# Patient Record
Sex: Female | Born: 1980 | Race: Black or African American | Hispanic: No | Marital: Married | Smoking: Former smoker
Health system: Southern US, Community
[De-identification: ages and names within clinical notes are randomized; demographics above are authoritative.]

## PROBLEM LIST (undated history)

## (undated) ENCOUNTER — Inpatient Hospital Stay (HOSPITAL_COMMUNITY): Payer: Self-pay

## (undated) DIAGNOSIS — E876 Hypokalemia: Secondary | ICD-10-CM

## (undated) DIAGNOSIS — T4145XA Adverse effect of unspecified anesthetic, initial encounter: Secondary | ICD-10-CM

## (undated) DIAGNOSIS — Z9889 Other specified postprocedural states: Secondary | ICD-10-CM

## (undated) DIAGNOSIS — R112 Nausea with vomiting, unspecified: Secondary | ICD-10-CM

## (undated) DIAGNOSIS — G4700 Insomnia, unspecified: Secondary | ICD-10-CM

## (undated) DIAGNOSIS — IMO0002 Reserved for concepts with insufficient information to code with codable children: Secondary | ICD-10-CM

## (undated) DIAGNOSIS — I10 Essential (primary) hypertension: Secondary | ICD-10-CM

## (undated) DIAGNOSIS — R87629 Unspecified abnormal cytological findings in specimens from vagina: Secondary | ICD-10-CM

## (undated) DIAGNOSIS — T8859XA Other complications of anesthesia, initial encounter: Secondary | ICD-10-CM

## (undated) DIAGNOSIS — F329 Major depressive disorder, single episode, unspecified: Secondary | ICD-10-CM

## (undated) DIAGNOSIS — F32A Depression, unspecified: Secondary | ICD-10-CM

## (undated) HISTORY — PX: LAPAROSCOPIC GASTRIC SLEEVE RESECTION: SHX5895

## (undated) HISTORY — DX: Hypokalemia: E87.6

## (undated) HISTORY — PX: WISDOM TOOTH EXTRACTION: SHX21

## (undated) HISTORY — DX: Unspecified abnormal cytological findings in specimens from vagina: R87.629

## (undated) HISTORY — PX: BREAST SURGERY: SHX581

## (undated) HISTORY — PX: DILATION AND CURETTAGE OF UTERUS: SHX78

## (undated) HISTORY — DX: Reserved for concepts with insufficient information to code with codable children: IMO0002

## (undated) HISTORY — DX: Insomnia, unspecified: G47.00

---
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
## 2000-05-17 ENCOUNTER — Encounter: Payer: Self-pay | Admitting: Emergency Medicine

## 2000-05-17 ENCOUNTER — Emergency Department (HOSPITAL_COMMUNITY): Admission: EM | Admit: 2000-05-17 | Discharge: 2000-05-17 | Payer: Self-pay | Admitting: Emergency Medicine

## 2000-09-09 ENCOUNTER — Encounter: Payer: Self-pay | Admitting: Emergency Medicine

## 2000-09-09 ENCOUNTER — Emergency Department (HOSPITAL_COMMUNITY): Admission: EM | Admit: 2000-09-09 | Discharge: 2000-09-09 | Payer: Self-pay | Admitting: Internal Medicine

## 2001-06-05 ENCOUNTER — Inpatient Hospital Stay (HOSPITAL_COMMUNITY): Admission: AD | Admit: 2001-06-05 | Discharge: 2001-06-05 | Payer: Self-pay | Admitting: *Deleted

## 2001-06-09 ENCOUNTER — Encounter: Admission: RE | Admit: 2001-06-09 | Discharge: 2001-06-09 | Payer: Self-pay | Admitting: Obstetrics

## 2001-06-30 ENCOUNTER — Encounter: Admission: RE | Admit: 2001-06-30 | Discharge: 2001-06-30 | Payer: Self-pay | Admitting: Obstetrics

## 2001-07-10 ENCOUNTER — Inpatient Hospital Stay (HOSPITAL_COMMUNITY): Admission: AD | Admit: 2001-07-10 | Discharge: 2001-07-10 | Payer: Self-pay | Admitting: *Deleted

## 2001-09-13 ENCOUNTER — Inpatient Hospital Stay (HOSPITAL_COMMUNITY): Admission: AD | Admit: 2001-09-13 | Discharge: 2001-09-13 | Payer: Self-pay | Admitting: *Deleted

## 2001-09-16 ENCOUNTER — Inpatient Hospital Stay (HOSPITAL_COMMUNITY): Admission: AD | Admit: 2001-09-16 | Discharge: 2001-09-16 | Payer: Self-pay | Admitting: *Deleted

## 2001-10-12 ENCOUNTER — Inpatient Hospital Stay (HOSPITAL_COMMUNITY): Admission: AD | Admit: 2001-10-12 | Discharge: 2001-10-12 | Payer: Self-pay | Admitting: Obstetrics

## 2001-11-03 ENCOUNTER — Encounter: Admission: RE | Admit: 2001-11-03 | Discharge: 2001-11-03 | Payer: Self-pay | Admitting: Obstetrics

## 2001-11-17 ENCOUNTER — Ambulatory Visit (HOSPITAL_COMMUNITY): Admission: RE | Admit: 2001-11-17 | Discharge: 2001-11-17 | Payer: Self-pay | Admitting: Obstetrics

## 2001-12-16 ENCOUNTER — Inpatient Hospital Stay (HOSPITAL_COMMUNITY): Admission: AD | Admit: 2001-12-16 | Discharge: 2001-12-16 | Payer: Self-pay | Admitting: *Deleted

## 2002-02-02 ENCOUNTER — Inpatient Hospital Stay (HOSPITAL_COMMUNITY): Admission: AD | Admit: 2002-02-02 | Discharge: 2002-02-02 | Payer: Self-pay | Admitting: Obstetrics & Gynecology

## 2002-06-27 ENCOUNTER — Emergency Department (HOSPITAL_COMMUNITY): Admission: EM | Admit: 2002-06-27 | Discharge: 2002-06-27 | Payer: Self-pay | Admitting: Emergency Medicine

## 2003-01-12 ENCOUNTER — Inpatient Hospital Stay (HOSPITAL_COMMUNITY): Admission: AD | Admit: 2003-01-12 | Discharge: 2003-01-12 | Payer: Self-pay | Admitting: Family Medicine

## 2003-04-04 ENCOUNTER — Emergency Department (HOSPITAL_COMMUNITY): Admission: EM | Admit: 2003-04-04 | Discharge: 2003-04-04 | Payer: Self-pay | Admitting: *Deleted

## 2003-05-29 ENCOUNTER — Other Ambulatory Visit: Admission: RE | Admit: 2003-05-29 | Discharge: 2003-05-29 | Payer: Self-pay | Admitting: Family Medicine

## 2003-08-16 ENCOUNTER — Inpatient Hospital Stay (HOSPITAL_COMMUNITY): Admission: AD | Admit: 2003-08-16 | Discharge: 2003-08-16 | Payer: Self-pay | Admitting: Obstetrics & Gynecology

## 2003-11-06 ENCOUNTER — Emergency Department (HOSPITAL_COMMUNITY): Admission: EM | Admit: 2003-11-06 | Discharge: 2003-11-06 | Payer: Self-pay | Admitting: Emergency Medicine

## 2004-01-15 ENCOUNTER — Emergency Department (HOSPITAL_COMMUNITY): Admission: EM | Admit: 2004-01-15 | Discharge: 2004-01-15 | Payer: Self-pay | Admitting: Emergency Medicine

## 2004-11-13 ENCOUNTER — Emergency Department (HOSPITAL_COMMUNITY): Admission: EM | Admit: 2004-11-13 | Discharge: 2004-11-13 | Payer: Self-pay | Admitting: Family Medicine

## 2005-05-05 ENCOUNTER — Emergency Department (HOSPITAL_COMMUNITY): Admission: EM | Admit: 2005-05-05 | Discharge: 2005-05-05 | Payer: Self-pay | Admitting: Family Medicine

## 2005-05-09 ENCOUNTER — Emergency Department (HOSPITAL_COMMUNITY): Admission: EM | Admit: 2005-05-09 | Discharge: 2005-05-09 | Payer: Self-pay | Admitting: Emergency Medicine

## 2005-08-03 ENCOUNTER — Emergency Department (HOSPITAL_COMMUNITY): Admission: EM | Admit: 2005-08-03 | Discharge: 2005-08-03 | Payer: Self-pay | Admitting: Emergency Medicine

## 2005-08-23 ENCOUNTER — Emergency Department (HOSPITAL_COMMUNITY): Admission: EM | Admit: 2005-08-23 | Discharge: 2005-08-23 | Payer: Self-pay | Admitting: Emergency Medicine

## 2005-11-09 DIAGNOSIS — Z9889 Other specified postprocedural states: Secondary | ICD-10-CM

## 2005-11-09 HISTORY — DX: Other specified postprocedural states: Z98.890

## 2006-09-13 ENCOUNTER — Emergency Department (HOSPITAL_COMMUNITY): Admission: EM | Admit: 2006-09-13 | Discharge: 2006-09-13 | Payer: Self-pay | Admitting: Family Medicine

## 2007-05-30 ENCOUNTER — Emergency Department (HOSPITAL_COMMUNITY): Admission: EM | Admit: 2007-05-30 | Discharge: 2007-05-30 | Payer: Self-pay | Admitting: Family Medicine

## 2007-07-11 ENCOUNTER — Emergency Department (HOSPITAL_COMMUNITY): Admission: EM | Admit: 2007-07-11 | Discharge: 2007-07-11 | Payer: Self-pay | Admitting: Emergency Medicine

## 2007-08-17 ENCOUNTER — Ambulatory Visit: Payer: Self-pay | Admitting: Gastroenterology

## 2007-08-17 LAB — CONVERTED CEMR LAB
AST: 17 units/L (ref 0–37)
Albumin: 3.3 g/dL — ABNORMAL LOW (ref 3.5–5.2)
BUN: 7 mg/dL (ref 6–23)
Basophils Relative: 0.6 % (ref 0.0–1.0)
Creatinine, Ser: 0.6 mg/dL (ref 0.4–1.2)
GFR calc Af Amer: 155 mL/min
GFR calc non Af Amer: 128 mL/min
HCT: 35.9 % — ABNORMAL LOW (ref 36.0–46.0)
Hemoglobin: 12 g/dL (ref 12.0–15.0)
Monocytes Absolute: 0.7 10*3/uL (ref 0.2–0.7)
Monocytes Relative: 7.8 % (ref 3.0–11.0)
Potassium: 4 meq/L (ref 3.5–5.1)
RDW: 13.9 % (ref 11.5–14.6)
Total Bilirubin: 0.5 mg/dL (ref 0.3–1.2)
Total Protein: 6.5 g/dL (ref 6.0–8.3)

## 2007-08-23 ENCOUNTER — Encounter: Admission: RE | Admit: 2007-08-23 | Discharge: 2007-08-23 | Payer: Self-pay | Admitting: Gastroenterology

## 2007-11-27 ENCOUNTER — Emergency Department (HOSPITAL_COMMUNITY): Admission: EM | Admit: 2007-11-27 | Discharge: 2007-11-27 | Payer: Self-pay | Admitting: *Deleted

## 2008-01-19 DIAGNOSIS — K5900 Constipation, unspecified: Secondary | ICD-10-CM | POA: Insufficient documentation

## 2008-01-31 ENCOUNTER — Emergency Department (HOSPITAL_COMMUNITY): Admission: EM | Admit: 2008-01-31 | Discharge: 2008-01-31 | Payer: Self-pay | Admitting: Family Medicine

## 2008-07-04 ENCOUNTER — Other Ambulatory Visit: Admission: RE | Admit: 2008-07-04 | Discharge: 2008-07-04 | Payer: Self-pay | Admitting: Gynecology

## 2008-08-07 ENCOUNTER — Ambulatory Visit (HOSPITAL_COMMUNITY): Admission: RE | Admit: 2008-08-07 | Discharge: 2008-08-07 | Payer: Self-pay | Admitting: Gynecology

## 2008-08-24 ENCOUNTER — Emergency Department (HOSPITAL_COMMUNITY): Admission: EM | Admit: 2008-08-24 | Discharge: 2008-08-24 | Payer: Self-pay | Admitting: Emergency Medicine

## 2008-11-05 ENCOUNTER — Emergency Department (HOSPITAL_COMMUNITY): Admission: EM | Admit: 2008-11-05 | Discharge: 2008-11-05 | Payer: Self-pay | Admitting: Family Medicine

## 2008-11-09 ENCOUNTER — Emergency Department (HOSPITAL_COMMUNITY): Admission: EM | Admit: 2008-11-09 | Discharge: 2008-11-09 | Payer: Self-pay | Admitting: Family Medicine

## 2009-01-24 ENCOUNTER — Inpatient Hospital Stay (HOSPITAL_COMMUNITY): Admission: AD | Admit: 2009-01-24 | Discharge: 2009-01-24 | Payer: Self-pay | Admitting: Obstetrics and Gynecology

## 2009-01-24 ENCOUNTER — Ambulatory Visit: Payer: Self-pay | Admitting: Obstetrics and Gynecology

## 2009-11-09 DIAGNOSIS — R87619 Unspecified abnormal cytological findings in specimens from cervix uteri: Secondary | ICD-10-CM

## 2009-11-09 DIAGNOSIS — IMO0002 Reserved for concepts with insufficient information to code with codable children: Secondary | ICD-10-CM

## 2009-11-09 HISTORY — DX: Reserved for concepts with insufficient information to code with codable children: IMO0002

## 2009-11-09 HISTORY — DX: Unspecified abnormal cytological findings in specimens from cervix uteri: R87.619

## 2010-03-23 ENCOUNTER — Inpatient Hospital Stay (HOSPITAL_COMMUNITY): Admission: AD | Admit: 2010-03-23 | Discharge: 2010-03-23 | Payer: Self-pay | Admitting: Obstetrics and Gynecology

## 2010-12-02 ENCOUNTER — Inpatient Hospital Stay (HOSPITAL_COMMUNITY)
Admission: AD | Admit: 2010-12-02 | Discharge: 2010-12-02 | Payer: Self-pay | Source: Home / Self Care | Attending: Obstetrics and Gynecology | Admitting: Obstetrics and Gynecology

## 2010-12-03 LAB — URINALYSIS, ROUTINE W REFLEX MICROSCOPIC
Nitrite: NEGATIVE
Specific Gravity, Urine: 1.025 (ref 1.005–1.030)
pH: 6 (ref 5.0–8.0)

## 2010-12-03 LAB — ABO/RH: ABO/RH(D): O POS

## 2010-12-03 LAB — POCT PREGNANCY, URINE: Preg Test, Ur: POSITIVE

## 2010-12-03 LAB — CBC
Hemoglobin: 11.8 g/dL — ABNORMAL LOW (ref 12.0–15.0)
MCHC: 33.1 g/dL (ref 30.0–36.0)
RBC: 4.55 MIL/uL (ref 3.87–5.11)
WBC: 9.9 10*3/uL (ref 4.0–10.5)

## 2010-12-03 LAB — HCG, QUANTITATIVE, PREGNANCY: hCG, Beta Chain, Quant, S: 450 m[IU]/mL — ABNORMAL HIGH (ref <5)

## 2010-12-24 LAB — RPR: RPR: NONREACTIVE

## 2010-12-24 LAB — ANTIBODY SCREEN: Antibody Screen: NEGATIVE

## 2010-12-24 LAB — GC/CHLAMYDIA PROBE AMP, GENITAL: Chlamydia: NEGATIVE

## 2010-12-29 ENCOUNTER — Inpatient Hospital Stay (HOSPITAL_COMMUNITY)
Admission: AD | Admit: 2010-12-29 | Discharge: 2010-12-29 | Disposition: A | Discharge status: Home / Self Care | Payer: Medicaid Other | Source: Ambulatory Visit | Attending: Obstetrics and Gynecology | Admitting: Obstetrics and Gynecology

## 2010-12-29 DIAGNOSIS — O210 Mild hyperemesis gravidarum: Secondary | ICD-10-CM | POA: Insufficient documentation

## 2010-12-29 LAB — URINALYSIS, ROUTINE W REFLEX MICROSCOPIC
Bilirubin Urine: NEGATIVE
Hgb urine dipstick: NEGATIVE
Ketones, ur: NEGATIVE mg/dL
Protein, ur: NEGATIVE mg/dL
Urine Glucose, Fasting: NEGATIVE mg/dL

## 2011-01-26 LAB — URINE MICROSCOPIC-ADD ON

## 2011-01-26 LAB — CBC
MCV: 82 fL (ref 78.0–100.0)
Platelets: 295 10*3/uL (ref 150–400)
RDW: 15.8 % — ABNORMAL HIGH (ref 11.5–15.5)
WBC: 10.7 10*3/uL — ABNORMAL HIGH (ref 4.0–10.5)

## 2011-01-26 LAB — URINALYSIS, ROUTINE W REFLEX MICROSCOPIC
Bilirubin Urine: NEGATIVE
Glucose, UA: NEGATIVE mg/dL
Ketones, ur: NEGATIVE mg/dL
pH: 6 (ref 5.0–8.0)

## 2011-01-26 LAB — POCT PREGNANCY, URINE: Preg Test, Ur: NEGATIVE

## 2011-02-19 LAB — HCG, QUANTITATIVE, PREGNANCY: hCG, Beta Chain, Quant, S: 173 m[IU]/mL — ABNORMAL HIGH (ref <5)

## 2011-02-19 LAB — CBC
HCT: 36.3 % (ref 36.0–46.0)
Hemoglobin: 11.9 g/dL — ABNORMAL LOW (ref 12.0–15.0)
Platelets: 288 10*3/uL (ref 150–400)
WBC: 10.5 10*3/uL (ref 4.0–10.5)

## 2011-02-19 LAB — URINALYSIS, ROUTINE W REFLEX MICROSCOPIC
Bilirubin Urine: NEGATIVE
Nitrite: NEGATIVE
Specific Gravity, Urine: 1.02 (ref 1.005–1.030)
Urobilinogen, UA: 0.2 mg/dL (ref 0.0–1.0)
pH: 6 (ref 5.0–8.0)

## 2011-02-19 LAB — DIFFERENTIAL
Basophils Absolute: 0 10*3/uL (ref 0.0–0.1)
Eosinophils Relative: 1 % (ref 0–5)
Lymphocytes Relative: 24 % (ref 12–46)
Lymphs Abs: 2.5 10*3/uL (ref 0.7–4.0)
Neutro Abs: 7 10*3/uL (ref 1.7–7.7)

## 2011-03-24 NOTE — Assessment & Plan Note (Signed)
[facility]                         GASTROENTEROLOGY OFFICE NOTE   Michelle Duarte, Michelle Duarte                        MRN:          [id number]  DATE:08/17/2007                            DOB:          04-12-1981    CHIEF COMPLAINT:  A 30 year old African-American female self referred  for episodic epigastric pain, nausea, and vomiting.   HISTORY OF PRESENT ILLNESS:  The patient relates a several month history  of episodic postprandial epigastric pain associated with nausea and  vomiting.  Her symptoms occur more frequently after greasy or fatty  foods.  She has noted an increase in intestinal gas as well with  increased belching and flatus after some meals.  She notes no substernal  burning or regurgitation.  She has had intermittent problems with mild  constipation associated with intermittent intestinal gas and bloating.  These symptoms do not appear to be related to her episodic epigastric  pain, nausea and vomiting.  She relates no hematemesis, dysphagia,  odynophagia, melena, hematochezia, or change in stool caliber.  No  family history of colon cancer, colon polyps, or inflammatory bowel  disease.   PAST MEDICAL HISTORY:  Status post bilateral breast reduction  possible history of lactose intolerance.   CURRENT MEDICATIONS:  None.   ALLERGIES:  No known drug allergies.   SOCIAL HISTORY:   REVIEW OF SYSTEMS:  Per the handwritten form.   PHYSICAL EXAMINATION:  GENERAL:  Overweight, no acute distress.  VITAL SIGNS:  Height 5 feet 3 inches, weight 233.8 pounds, blood  pressure 122/78, pulse 88 and regular.  HEENT:  Anicteric sclerae, oropharynx clear.  CHEST:  Clear to auscultation bilaterally.  HEART:  Regular rate and rhythm without murmurs appreciated.  ABDOMEN:  Soft and nondistended.  Minimal epigastric tenderness to deep  palpation.  No rebound or guarding.  No palpable organomegaly, masses,  or hernias.  Normal active bowel sounds.  EXTREMITIES:  Without cyanosis, clubbing, or edema.  NEUROLOGY:  Alert and oriented x3.  Grossly nonfocal.   ASSESSMENT:  Rule out symptomatic cholelithiasis, gastroesophageal  reflux disease, and ulcer disease.  She also appears to have a component  of constipation and intestinal gas.  Obtain a CBC, CMET, lipase, and TSH  today.  Schedule an abdominal ultrasound.  If the above evaluation is  not diagnostic, we will plan to proceed with an upper endoscopy for  further  evaluation.  Risks, benefits, and alternatives to upper endoscopy with  possible biopsy discussed  with the patient and she agrees to proceed if the ultrasound is not  diagnostic.  She is to maintain a low fat diet for now.     Venita Lick. Russella Dar, MD, [facility]  Electronically Signed    MTS/MedQ  DD: 08/17/2007  DT: 08/17/2007  Job #: [id number]

## 2011-08-03 ENCOUNTER — Encounter (HOSPITAL_COMMUNITY): Payer: Self-pay | Admitting: *Deleted

## 2011-08-03 ENCOUNTER — Telehealth (HOSPITAL_COMMUNITY): Payer: Self-pay | Admitting: *Deleted

## 2011-08-03 LAB — GC/CHLAMYDIA PROBE AMP, GENITAL
Chlamydia, DNA Probe: NEGATIVE
GC Probe Amp, Genital: NEGATIVE

## 2011-08-03 LAB — WET PREP, GENITAL: Trich, Wet Prep: NONE SEEN

## 2011-08-03 NOTE — Telephone Encounter (Signed)
Preadmission screen  

## 2011-08-04 ENCOUNTER — Encounter (HOSPITAL_COMMUNITY): Payer: Self-pay | Admitting: *Deleted

## 2011-08-05 ENCOUNTER — Other Ambulatory Visit: Payer: Self-pay | Admitting: Obstetrics and Gynecology

## 2011-08-05 ENCOUNTER — Inpatient Hospital Stay (HOSPITAL_COMMUNITY)
Admission: RE | Admit: 2011-08-05 | Discharge: 2011-08-08 | DRG: 775 | Disposition: A | Discharge status: Home / Self Care | Payer: Medicaid Other | Source: Ambulatory Visit | Attending: Obstetrics and Gynecology | Admitting: Obstetrics and Gynecology

## 2011-08-05 DIAGNOSIS — K5900 Constipation, unspecified: Secondary | ICD-10-CM

## 2011-08-05 LAB — CBC
HCT: 32.4 % — ABNORMAL LOW (ref 36.0–46.0)
Hemoglobin: 10.6 g/dL — ABNORMAL LOW (ref 12.0–15.0)
MCH: 26 pg (ref 26.0–34.0)
MCV: 79.4 fL (ref 78.0–100.0)
Platelets: 269 10*3/uL (ref 150–400)
RBC: 4.08 MIL/uL (ref 3.87–5.11)

## 2011-08-05 MED ORDER — LACTATED RINGERS IV SOLN
INTRAVENOUS | Status: DC
  Administered 2011-08-06: 500 mL via INTRAVENOUS
  Administered 2011-08-06 (×2): via INTRAVENOUS

## 2011-08-05 MED ORDER — FLEET ENEMA 7-19 GM/118ML RE ENEM: 1.0000 | ENEMA | RECTAL | Status: DC | PRN

## 2011-08-05 MED ORDER — OXYTOCIN 20 UNITS IN LACTATED RINGERS INFUSION - SIMPLE
125.0000 mL/h | Freq: Once | INTRAVENOUS | Status: AC
  Administered 2011-08-06: 125 mL/h via INTRAVENOUS

## 2011-08-05 MED ORDER — LIDOCAINE HCL (PF) 1 % IJ SOLN
30.0000 mL | INTRAMUSCULAR | Status: DC | PRN
  Filled 2011-08-05 (×3): qty 30

## 2011-08-05 MED ORDER — ACETAMINOPHEN 325 MG PO TABS
650.0000 mg | ORAL_TABLET | ORAL | Status: DC | PRN
  Administered 2011-08-05 – 2011-08-06 (×2): 650 mg via ORAL
  Filled 2011-08-05 (×2): qty 2

## 2011-08-05 MED ORDER — TERBUTALINE SULFATE 1 MG/ML IJ SOLN: 0.2500 mg | Freq: Once | INTRAMUSCULAR | Status: AC | PRN

## 2011-08-05 MED ORDER — MISOPROSTOL 25 MCG QUARTER TABLET
25.0000 ug | ORAL_TABLET | ORAL | Status: DC | PRN
  Administered 2011-08-05 – 2011-08-06 (×2): 25 ug via VAGINAL
  Filled 2011-08-05: qty 1
  Filled 2011-08-05 (×2): qty 0.25

## 2011-08-05 MED ORDER — CITRIC ACID-SODIUM CITRATE 334-500 MG/5ML PO SOLN: 30.0000 mL | ORAL | Status: DC | PRN

## 2011-08-05 MED ORDER — OXYCODONE-ACETAMINOPHEN 5-325 MG PO TABS: 2.0000 | ORAL_TABLET | ORAL | Status: DC | PRN

## 2011-08-05 MED ORDER — ZOLPIDEM TARTRATE 10 MG PO TABS
10.0000 mg | ORAL_TABLET | Freq: Every evening | ORAL | Status: DC | PRN
  Administered 2011-08-05: 10 mg via ORAL
  Filled 2011-08-05: qty 1

## 2011-08-05 MED ORDER — LACTATED RINGERS IV SOLN: 500.0000 mL | INTRAVENOUS | Status: DC | PRN

## 2011-08-05 MED ORDER — IBUPROFEN 600 MG PO TABS: 600.0000 mg | ORAL_TABLET | Freq: Four times a day (QID) | ORAL | Status: DC | PRN

## 2011-08-05 MED ORDER — OXYTOCIN BOLUS FROM INFUSION
500.0000 mL | Freq: Once | INTRAVENOUS | Status: DC
  Filled 2011-08-05: qty 500

## 2011-08-05 MED ORDER — ONDANSETRON HCL 4 MG/2ML IJ SOLN: 4.0000 mg | Freq: Four times a day (QID) | INTRAMUSCULAR | Status: DC | PRN

## 2011-08-05 NOTE — H&P (Signed)
Michelle Duarte is an 30 y.o. female. G2P0 at 40 weeks with favorable cervix. EFW by palpation is 8.5 pounds.  Pertinent Gynecological History: Menses: pregnant Bleeding: pregnant Contraception: pregnant DES exposure: unknown Blood transfusions: none Sexually transmitted diseases: no past history Previous GYN Procedures: none  Last mammogram: none Date: none Last pap: normal Date: 01/19/11 OB History: G2, P0   Menstrual History: Menarche age: unknown Patient's last menstrual period was 11/05/2010.    Past Medical History  Diagnosis Date  . Abnormal Pap smear 2011    Past Surgical History  Procedure Date  . Breast surgery     reduction    Family History  Problem Relation Age of Onset  . Hypertension Mother   . Drug abuse Mother     recovering addict  . Hypertension Maternal Aunt   . Diabetes Paternal Aunt   . Hypertension Maternal Grandmother   . Diabetes Paternal Grandmother     Social History:  reports that she has never smoked. She does not have any smokeless tobacco history on file. She reports that she does not drink alcohol or use illicit drugs.  Allergies: Allergies no known allergies   (Not in a hospital admission)  Review of Systems  Constitutional: Negative for fever and chills.  Eyes: Negative for blurred vision.  Neurological: Negative for headaches.    Last menstrual period 11/05/2010. Physical Exam  Constitutional: She appears well-developed and well-nourished.  Cardiovascular: Normal rate and regular rhythm.   Respiratory: Effort normal and breath sounds normal.  Neurological: She has normal reflexes.    No results found for this or any previous visit (from the past 24 hour(s)).  No results found.  Assessment/Plan: 30 yo G2P0 at term with favorable cx and EFW 8.5 pounds by palpation for 2 stage induction of labor  Michelle Duarte,Michelle Duarte E 08/05/2011, 6:30 PM

## 2011-08-06 ENCOUNTER — Inpatient Hospital Stay (HOSPITAL_COMMUNITY): Payer: Medicaid Other | Admitting: Anesthesiology

## 2011-08-06 ENCOUNTER — Encounter (HOSPITAL_COMMUNITY): Payer: Self-pay | Admitting: Anesthesiology

## 2011-08-06 ENCOUNTER — Encounter (HOSPITAL_COMMUNITY): Payer: Self-pay

## 2011-08-06 MED ORDER — BISACODYL 10 MG RE SUPP: 10.0000 mg | Freq: Every day | RECTAL | Status: DC | PRN

## 2011-08-06 MED ORDER — DIPHENHYDRAMINE HCL 50 MG/ML IJ SOLN: 12.5000 mg | INTRAMUSCULAR | Status: DC | PRN

## 2011-08-06 MED ORDER — PHENYLEPHRINE 40 MCG/ML (10ML) SYRINGE FOR IV PUSH (FOR BLOOD PRESSURE SUPPORT)
80.0000 ug | PREFILLED_SYRINGE | INTRAVENOUS | Status: DC | PRN
  Filled 2011-08-06 (×2): qty 5

## 2011-08-06 MED ORDER — EPHEDRINE 5 MG/ML INJ
10.0000 mg | INTRAVENOUS | Status: DC | PRN
  Filled 2011-08-06: qty 4

## 2011-08-06 MED ORDER — SENNOSIDES-DOCUSATE SODIUM 8.6-50 MG PO TABS
2.0000 | ORAL_TABLET | Freq: Every day | ORAL | Status: DC
  Administered 2011-08-06: 2 via ORAL

## 2011-08-06 MED ORDER — FLEET ENEMA 7-19 GM/118ML RE ENEM: 1.0000 | ENEMA | RECTAL | Status: DC | PRN

## 2011-08-06 MED ORDER — IBUPROFEN 600 MG PO TABS
600.0000 mg | ORAL_TABLET | Freq: Four times a day (QID) | ORAL | Status: DC
  Administered 2011-08-07 – 2011-08-08 (×6): 600 mg via ORAL
  Filled 2011-08-06 (×7): qty 1

## 2011-08-06 MED ORDER — ONDANSETRON HCL 4 MG/2ML IJ SOLN: 4.0000 mg | INTRAMUSCULAR | Status: DC | PRN

## 2011-08-06 MED ORDER — LANOLIN HYDROUS EX OINT: TOPICAL_OINTMENT | CUTANEOUS | Status: DC | PRN

## 2011-08-06 MED ORDER — EPHEDRINE 5 MG/ML INJ
10.0000 mg | INTRAVENOUS | Status: DC | PRN
  Filled 2011-08-06 (×2): qty 4

## 2011-08-06 MED ORDER — SIMETHICONE 80 MG PO CHEW: 80.0000 mg | CHEWABLE_TABLET | ORAL | Status: DC | PRN

## 2011-08-06 MED ORDER — FENTANYL 2.5 MCG/ML BUPIVACAINE 1/10 % EPIDURAL INFUSION (WH - ANES)
14.0000 mL/h | INTRAMUSCULAR | Status: DC
  Administered 2011-08-06 (×3): 14 mL/h via EPIDURAL
  Filled 2011-08-06 (×4): qty 60

## 2011-08-06 MED ORDER — LIDOCAINE HCL 1.5 % IJ SOLN
INTRAMUSCULAR | Status: DC | PRN
  Administered 2011-08-06 (×2): 4 mL via EPIDURAL

## 2011-08-06 MED ORDER — ONDANSETRON HCL 4 MG PO TABS
4.0000 mg | ORAL_TABLET | ORAL | Status: DC | PRN
Start: 2011-08-06 — End: 2011-08-08

## 2011-08-06 MED ORDER — OXYCODONE-ACETAMINOPHEN 5-325 MG PO TABS
1.0000 | ORAL_TABLET | ORAL | Status: DC | PRN
  Administered 2011-08-07 – 2011-08-08 (×2): 1 via ORAL
  Filled 2011-08-06 (×2): qty 1

## 2011-08-06 MED ORDER — DIPHENHYDRAMINE HCL 25 MG PO CAPS: 25.0000 mg | ORAL_CAPSULE | Freq: Four times a day (QID) | ORAL | Status: DC | PRN

## 2011-08-06 MED ORDER — WITCH HAZEL-GLYCERIN EX PADS: 1.0000 "application " | MEDICATED_PAD | CUTANEOUS | Status: DC | PRN

## 2011-08-06 MED ORDER — ZOLPIDEM TARTRATE 5 MG PO TABS: 5.0000 mg | ORAL_TABLET | Freq: Every evening | ORAL | Status: DC | PRN

## 2011-08-06 MED ORDER — TERBUTALINE SULFATE 1 MG/ML IJ SOLN: 0.2500 mg | Freq: Once | INTRAMUSCULAR | Status: DC | PRN

## 2011-08-06 MED ORDER — BENZOCAINE-MENTHOL 20-0.5 % EX AERO: 1.0000 "application " | INHALATION_SPRAY | CUTANEOUS | Status: DC | PRN

## 2011-08-06 MED ORDER — OXYTOCIN 20 UNITS IN LACTATED RINGERS INFUSION - SIMPLE
1.0000 m[IU]/min | INTRAVENOUS | Status: DC
  Administered 2011-08-06: 4 m[IU]/min via INTRAVENOUS
  Administered 2011-08-06: 1 m[IU]/min via INTRAVENOUS
  Filled 2011-08-06: qty 1000

## 2011-08-06 MED ORDER — FENTANYL 2.5 MCG/ML BUPIVACAINE 1/10 % EPIDURAL INFUSION (WH - ANES)
INTRAMUSCULAR | Status: DC | PRN
  Administered 2011-08-06: 14 mL/h via EPIDURAL

## 2011-08-06 MED ORDER — DIBUCAINE 1 % RE OINT: 1.0000 "application " | TOPICAL_OINTMENT | RECTAL | Status: DC | PRN

## 2011-08-06 MED ORDER — LACTATED RINGERS IV SOLN: 500.0000 mL | Freq: Once | INTRAVENOUS | Status: DC

## 2011-08-06 MED ORDER — SODIUM CHLORIDE 0.9 % IV SOLN
3.0000 g | Freq: Four times a day (QID) | INTRAVENOUS | Status: DC
  Administered 2011-08-06: 3 g via INTRAVENOUS
  Filled 2011-08-06 (×3): qty 3

## 2011-08-06 MED ORDER — PRENATAL PLUS 27-1 MG PO TABS
1.0000 | ORAL_TABLET | Freq: Every day | ORAL | Status: DC
  Administered 2011-08-07 – 2011-08-08 (×2): 1 via ORAL
  Filled 2011-08-06 (×2): qty 1

## 2011-08-06 MED ORDER — BUTORPHANOL TARTRATE 2 MG/ML IJ SOLN
1.0000 mg | INTRAMUSCULAR | Status: DC | PRN
  Administered 2011-08-06: 1 mg via INTRAVENOUS
  Filled 2011-08-06: qty 1

## 2011-08-06 MED ORDER — TETANUS-DIPHTH-ACELL PERTUSSIS 5-2.5-18.5 LF-MCG/0.5 IM SUSP: 0.5000 mL | Freq: Once | INTRAMUSCULAR | Status: DC

## 2011-08-06 MED ORDER — PHENYLEPHRINE 40 MCG/ML (10ML) SYRINGE FOR IV PUSH (FOR BLOOD PRESSURE SUPPORT)
80.0000 ug | PREFILLED_SYRINGE | INTRAVENOUS | Status: DC | PRN
  Filled 2011-08-06: qty 5

## 2011-08-06 MED ORDER — PROMETHAZINE HCL 25 MG/ML IJ SOLN
12.5000 mg | INTRAMUSCULAR | Status: DC | PRN
  Administered 2011-08-06: 12.5 mg via INTRAVENOUS
  Filled 2011-08-06: qty 1

## 2011-08-06 NOTE — Progress Notes (Signed)
FHT reactive, good response to scalp stim Cx 4/C/-2 more anterior IUPC had pushed out-removed   Fresh IUPC placed UCs q2min-will assess MVUs Pitocin on, epidural in

## 2011-08-06 NOTE — Progress Notes (Signed)
FHT + accels UCs q3-4 min Epidural in Cx 4/C/-2/vtx AROM small amt clear fluid IUPC placed

## 2011-08-06 NOTE — Progress Notes (Signed)
Delivery Note Rapid second stage (less than 15 min) SVD VFI  Apgars 9/9 Wt/art pH - pending Placenta 3 vessels, intact EBL  300cc Cx/vag/perineum intact Pt/infant stable in LDR

## 2011-08-06 NOTE — Anesthesia Preprocedure Evaluation (Addendum)
Anesthesia Evaluation  Name, MR# and DOB Patient awake  General Assessment Comment  Reviewed: Allergy & Precautions, H&P , Patient's Chart, lab work & pertinent test results  Airway Mallampati: III TM Distance: >3 FB Neck ROM: full    Dental No notable dental hx. (+) Teeth Intact   Pulmonary  clear to auscultation  pulmonary exam normalPulmonary Exam Normal breath sounds clear to auscultation none    Cardiovascular regular Normal    Neuro/Psych Negative Neurological ROS  Negative Psych ROS  GI/Hepatic/Renal negative GI ROS  negative Liver ROS  negative Renal ROS        Endo/Other  Negative Endocrine ROS (+)   Morbid obesity  Abdominal   Musculoskeletal   Hematology negative hematology ROS (+)   Peds  Reproductive/Obstetrics (+) Pregnancy    Anesthesia Other Findings             Anesthesia Physical Anesthesia Plan  ASA: III  Anesthesia Plan: Epidural   Post-op Pain Management:    Induction:   Airway Management Planned:   Additional Equipment:   Intra-op Plan:   Post-operative Plan:   Informed Consent: I have reviewed the patients History and Physical, chart, labs and discussed the procedure including the risks, benefits and alternatives for the proposed anesthesia with the patient or authorized representative who has indicated his/her understanding and acceptance.     Plan Discussed with: Anesthesiologist  Anesthesia Plan Comments:         Anesthesia Quick Evaluation  

## 2011-08-06 NOTE — Progress Notes (Signed)
FHT with some accels UCs q3-4  About 30-40  Begin Pitocin augmentation D/W pt / husband

## 2011-08-06 NOTE — Anesthesia Procedure Notes (Signed)
Epidural Patient location during procedure: OB Start time: 08/06/2011 7:17 AM  Staffing Anesthesiologist: Stockton Nunley A. Performed by: anesthesiologist   Preanesthetic Checklist Completed: patient identified, site marked, surgical consent, pre-op evaluation, timeout performed, IV checked, risks and benefits discussed and monitors and equipment checked  Epidural Patient position: sitting Prep: site prepped and draped and DuraPrep Patient monitoring: continuous pulse ox and blood pressure Approach: midline Injection technique: LOR air  Needle:  Needle type: Tuohy  Needle gauge: 17 G Needle length: 9 cm Needle insertion depth: 8 cm Catheter type: closed end flexible Catheter size: 19 Gauge Catheter at skin depth: 13 cm Test dose: negative and 1.5% lidocaine  Assessment Events: blood not aspirated, injection not painful, no injection resistance, negative IV test and no paresthesia  Additional Notes Patient is more comfortable after epidural dosed. Please see RN's note for documentation of vital signs and FHR which are stable.

## 2011-08-07 LAB — CBC
MCV: 78.8 fL (ref 78.0–100.0)
Platelets: 236 10*3/uL (ref 150–400)
RBC: 3.53 MIL/uL — ABNORMAL LOW (ref 3.87–5.11)
RDW: 16.5 % — ABNORMAL HIGH (ref 11.5–15.5)
WBC: 13.8 10*3/uL — ABNORMAL HIGH (ref 4.0–10.5)

## 2011-08-07 MED ORDER — BENZOCAINE-MENTHOL 20-0.5 % EX AERO
INHALATION_SPRAY | CUTANEOUS | Status: AC
  Filled 2011-08-07: qty 56

## 2011-08-07 NOTE — Progress Notes (Signed)
UR Chart review completed.  

## 2011-08-07 NOTE — Progress Notes (Signed)
Post Partum Day 1 Subjective: no complaints and up ad lib  Objective: Blood pressure 100/65, pulse 87, temperature 98.6 F (37 C), temperature source Oral, resp. rate 18, height 5\' 5"  (1.651 m), weight 113.853 kg (251 lb), last menstrual period 11/05/2010, SpO2 93.00%, unknown if currently breastfeeding.  Physical Exam:  General: alert and cooperative Lochia: appropriate Uterine Fundus: firm Perineum intact DVT Evaluation: No evidence of DVT seen on physical exam.   Basename 08/07/11 0545 08/05/11 2100  HGB 9.1* 10.6*  HCT 27.8* 32.4*    Assessment/Plan: Plan for discharge tomorrow   LOS: 2 days   Makylee Sanborn G 08/07/2011, 8:21 AM

## 2011-08-08 NOTE — Discharge Summary (Signed)
Obstetric Discharge Summary Reason for Admission: induction of labor favorable cervix at term large efw Prenatal Procedures: ultrasound Intrapartum Procedures: spontaneous vaginal delivery Postpartum Procedures: none Complications-Operative and Postpartum: none Hemoglobin  Date Value Range Status  08/07/2011 9.1* 12.0-15.0 (g/dL) Final     HCT  Date Value Range Status  08/07/2011 27.8* 36.0-46.0 (%) Final    Discharge Diagnoses: Term Pregnancy-delivered  Discharge Information: Date: 08/08/2011 Activity: pelvic rest Diet: routine Medications: PNV Condition: stable Instructions: refer to practice specific booklet Discharge to: home   Newborn Data: Live born female  Birth Weight: 7 lb 2.1 oz (3235 g) APGAR: 9, 9  Home with mother.  Hollis Tuller S 08/08/2011, 9:24 AM

## 2011-08-08 NOTE — Anesthesia Postprocedure Evaluation (Signed)
Patient stable following vaginal delivery.  

## 2011-08-08 NOTE — Progress Notes (Signed)
Post Partum Day two Subjective: no complaints  Objective: Blood pressure 110/69, pulse 81, temperature 98 F (36.7 C), temperature source Oral, resp. rate 18, height 5\' 5"  (1.651 m), weight 251 lb (113.853 kg), last menstrual period 11/05/2010, SpO2 93.00%, unknown if currently breastfeeding.  Physical Exam:  General: alert Lochia: appropriate Uterine Fundus: firm Incision: no inc DVT Evaluation: No evidence of DVT seen on physical exam.   Basename 08/07/11 0545 08/05/11 2100  HGB 9.1* 10.6*  HCT 27.8* 32.4*    Assessment/Plan: Discharge home   LOS: 3 days   Joby Richart S 08/08/2011, 9:22 AM

## 2012-02-05 IMAGING — US US OB COMP LESS 14 WK
1 series · 14 of 28 positions shown · non-contrast
Comparison: None

CLINICAL DATA: *Abdominal pain Pregnancy; ; POSITIVE URINE
PREGNANCY TEST.  QUANTITATIVE BETA HCG PENDING.

OBSTETRIC <14 WK US AND TRANSVAGINAL OB US
TECHNIQUE: Both transabdominal and transvaginal ultrasound
examinations were performed for complete evaluation of the
gestation as well as the maternal uterus, adnexal regions, and
pelvic cul-de-sac.

[Series 1: us ob comp less 14 wks · 14 of 36 slices shown]
[im 2/36]
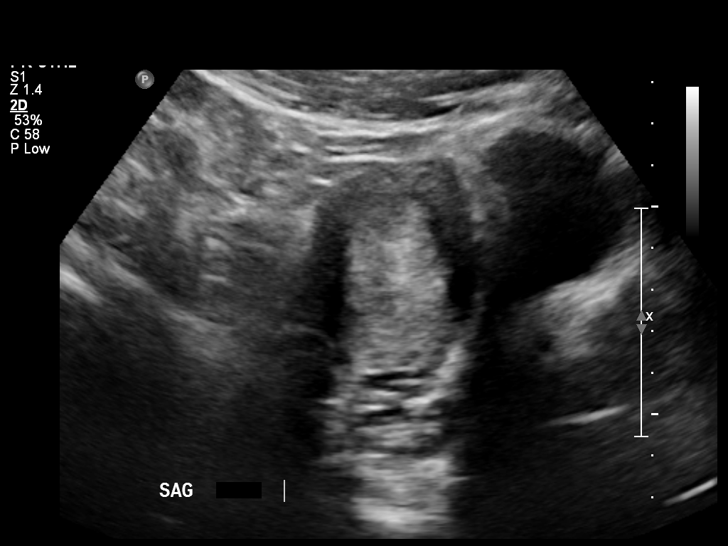
[im 4/36]
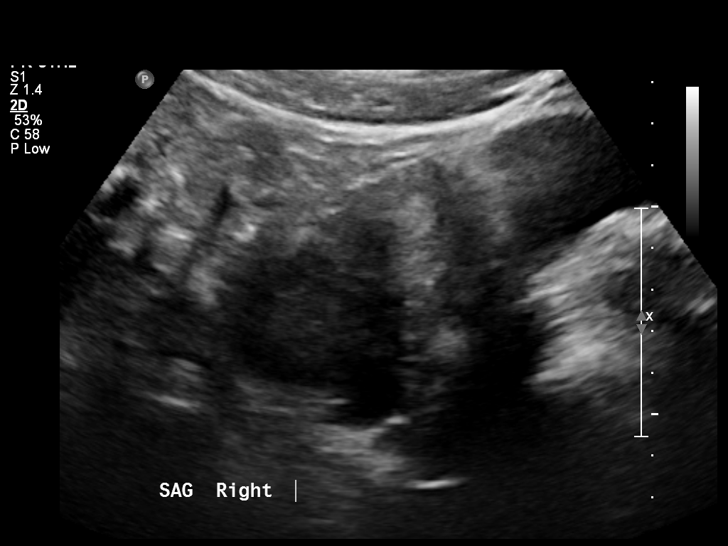
[im 7/36]
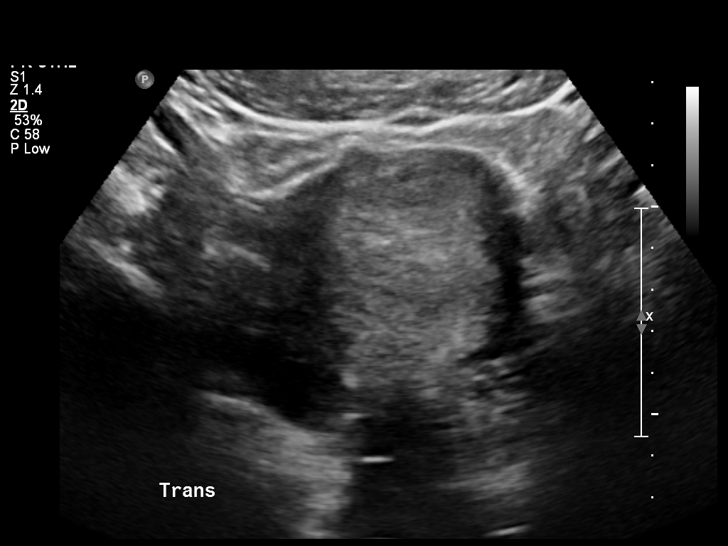
[im 10/36]
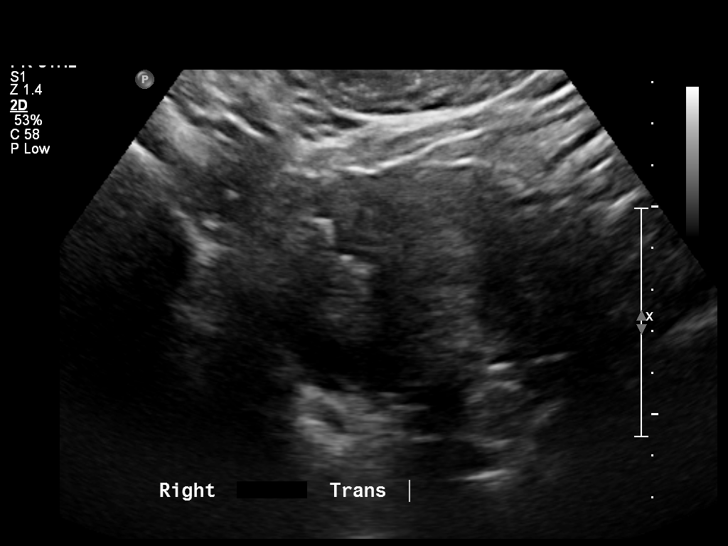
[im 12/36]
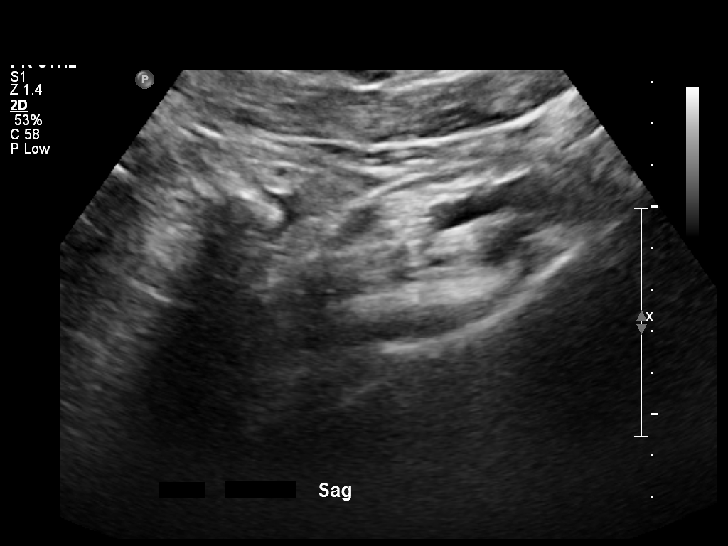
[im 15/36]
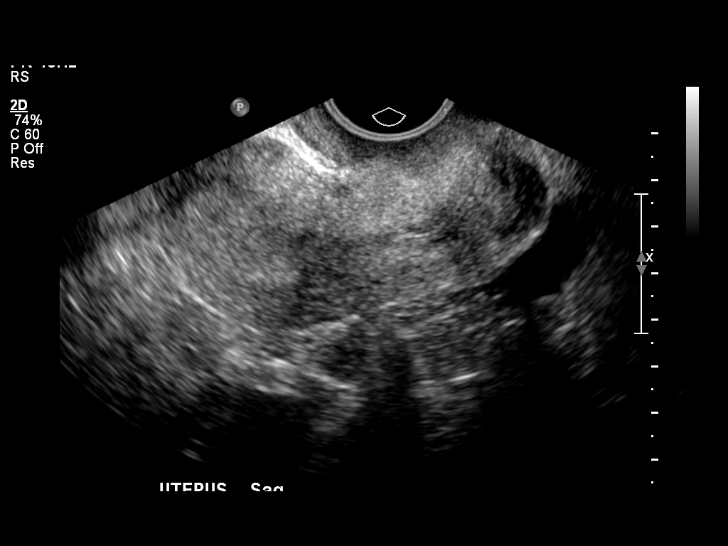
[im 17/36]
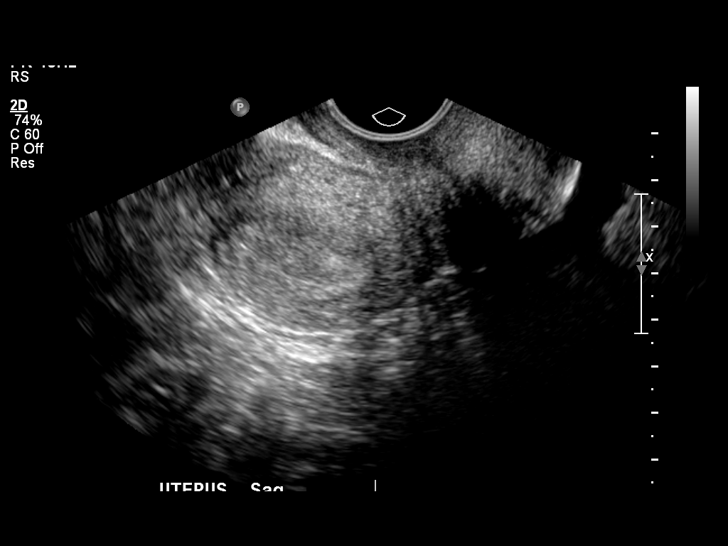
[im 20/36]
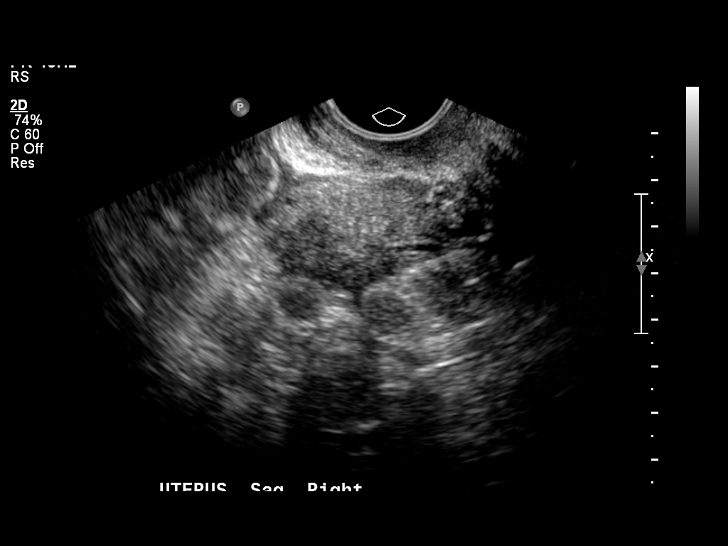
[im 23/36]
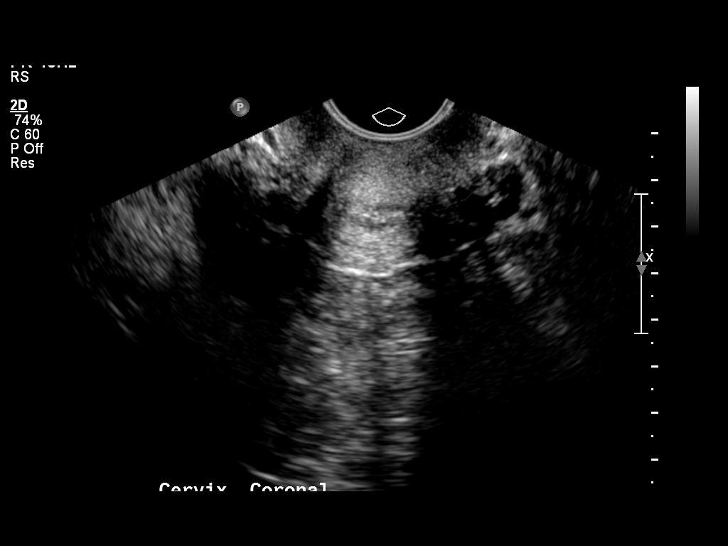
[im 25/36]
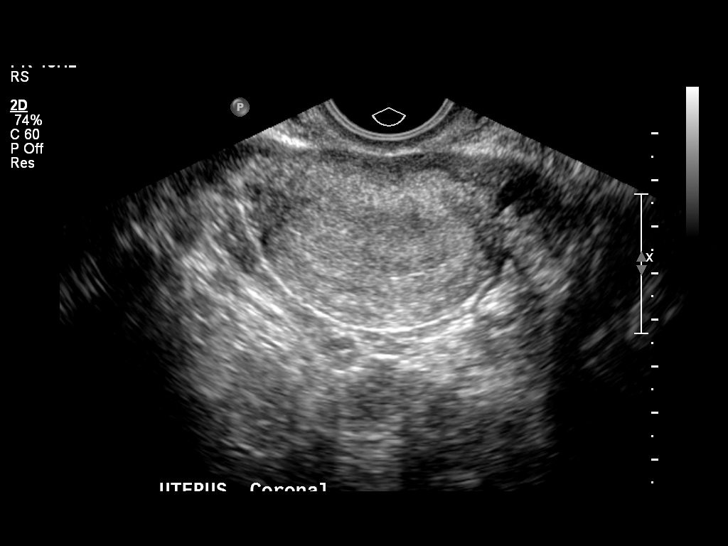
[im 28/36]
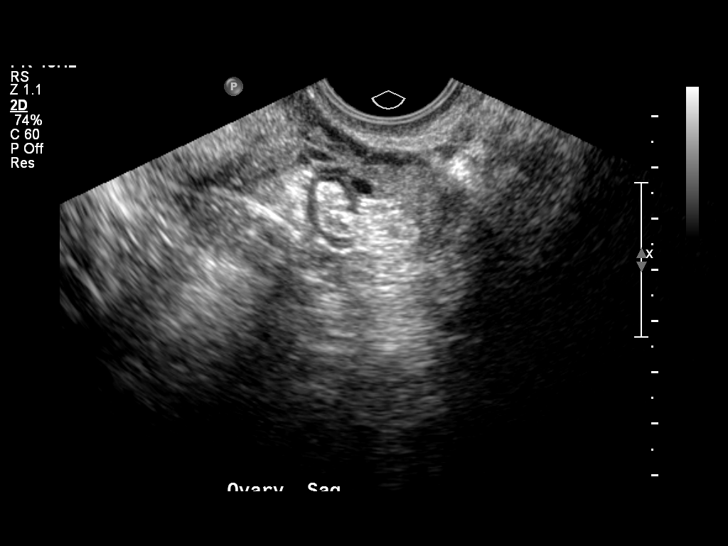
[im 30/36]
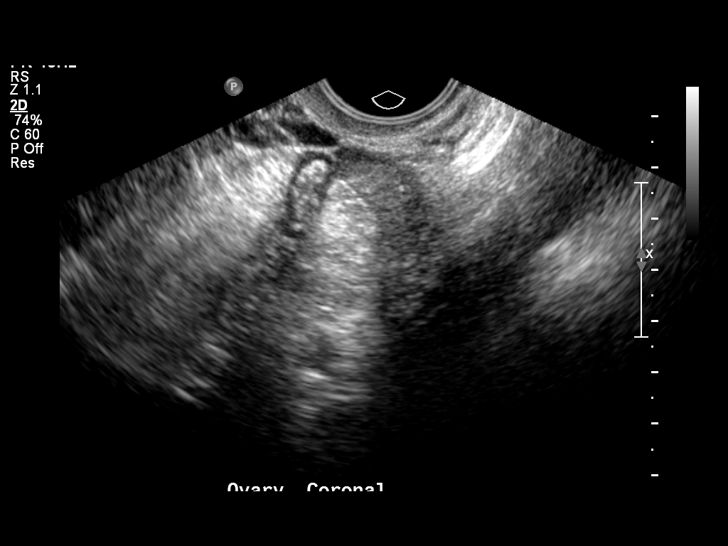
[im 33/36]
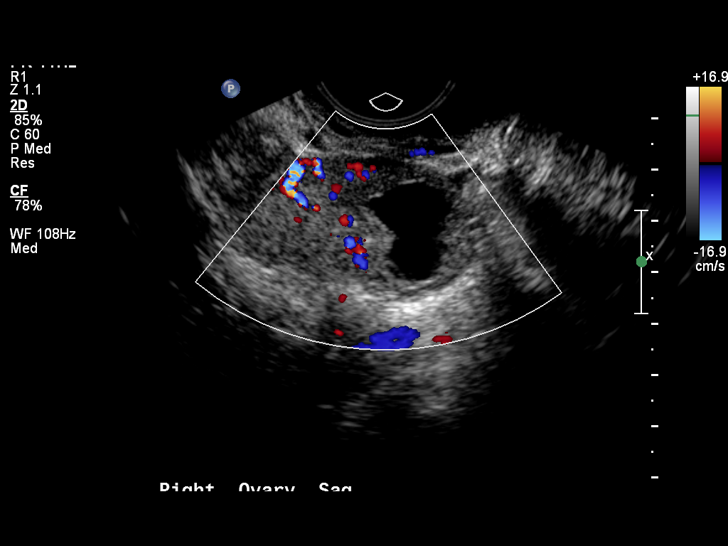
[im 36/36]
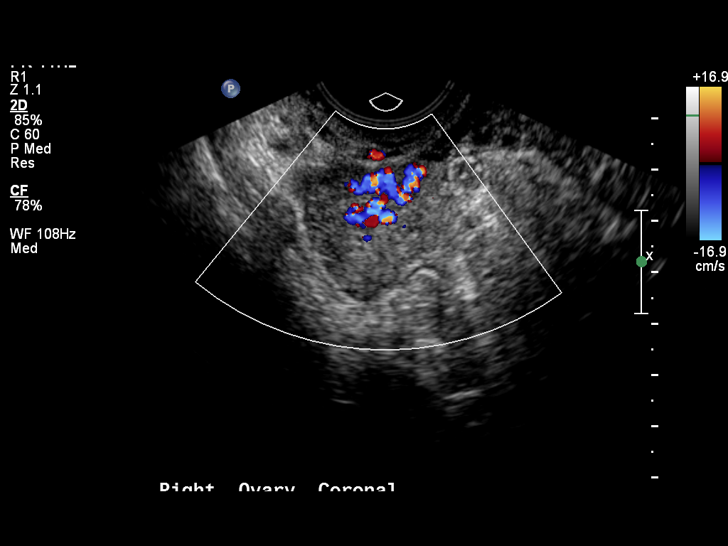

[14 of 28 positions shown; findings below may reference images not displayed]

FINDINGS: No intrauterine gestation is identified at this time.
Uterus is unremarkable.  Endometrium 11 mm in thickness.

Ovaries are symmetric in size and echotexture.  1.9 cm right corpus
luteal cyst noted.  Small amount of free fluid in the pelvis.
IMPRESSION: No intrauterine gestation visualized.  Differential considerations
would include early IUP too early to visualize, spontaneous
abortion, or occult ectopic pregnancy.  Recommend correlation with
quantitative HCG level, serial quantitative beta HCGs and
ultrasounds.

## 2013-04-03 ENCOUNTER — Encounter (HOSPITAL_COMMUNITY): Payer: Self-pay | Admitting: *Deleted

## 2013-04-03 ENCOUNTER — Emergency Department (HOSPITAL_COMMUNITY)
Admission: EM | Admit: 2013-04-03 | Discharge: 2013-04-03 | Disposition: A | Discharge status: Home / Self Care | Payer: 59 | Attending: Emergency Medicine | Admitting: Emergency Medicine

## 2013-04-03 DIAGNOSIS — L0501 Pilonidal cyst with abscess: Secondary | ICD-10-CM | POA: Insufficient documentation

## 2013-04-03 MED ORDER — HYDROCODONE-ACETAMINOPHEN 5-325 MG PO TABS: 1.0000 | ORAL_TABLET | ORAL | Status: DC | PRN

## 2013-04-03 MED ORDER — LIDOCAINE HCL 2 % IJ SOLN
INTRAMUSCULAR | Status: AC
  Administered 2013-04-03: 03:00:00
  Filled 2013-04-03: qty 20

## 2013-04-03 MED ORDER — SULFAMETHOXAZOLE-TRIMETHOPRIM 800-160 MG PO TABS: 1.0000 | ORAL_TABLET | Freq: Two times a day (BID) | ORAL | Status: DC

## 2013-04-03 MED ORDER — FLUCONAZOLE 200 MG PO TABS: 200.0000 mg | ORAL_TABLET | Freq: Every day | ORAL | Status: AC

## 2013-04-03 NOTE — ED Provider Notes (Signed)
History     CSN: [id number]  Arrival date & time 04/03/13  0007   First MD Initiated Contact with Patient 04/03/13 0123      Chief Complaint  Patient presents with  . Abscess    (Consider location/radiation/quality/duration/timing/severity/associated sxs/prior treatment) The history is provided by the patient.   patient reports worsening pain over her sacrum over the past 2 weeks.  Tonight she got out of shower and had some drainage from that area.  Her mom pushed on her superior gluteal cleft and large amount of foul-smelling pus came out.  Patient presents emergency apartment for evaluation.  If fevers or chills.  No prior history of pilonidal abscess.  She denies abdominal pain.  Her symptoms are mild in severity.  Her pain is improved at amount of pus out.  Her pain is worsened by palpation of the area.  No surrounding erythema   Past Medical History  Diagnosis Date  . Abnormal Pap smear 2011    Past Surgical History  Procedure Laterality Date  . Breast surgery      reduction    Family History  Problem Relation Age of Onset  . Hypertension Mother   . Drug abuse Mother     recovering addict  . Hypertension Maternal Aunt   . Diabetes Paternal Aunt   . Hypertension Maternal Grandmother   . Diabetes Paternal Grandmother     History  Substance Use Topics  . Smoking status: Never Smoker   . Smokeless tobacco: Not on file  . Alcohol Use: No    OB History   Grav Para Term Preterm Abortions TAB SAB Ect Mult Living   2 1 1  1 1    1       Review of Systems  All other systems reviewed and are negative.    Allergies  Review of patient's allergies indicates no known allergies.  Home Medications   Current Outpatient Rx  Name  Route  Sig  Dispense  Refill  . naproxen sodium (ANAPROX) 220 MG tablet   Oral   Take 440 mg by mouth 2 (two) times daily with a meal.         . fluconazole (DIFLUCAN) 200 MG tablet   Oral   Take 1 tablet (200 mg total) by mouth  daily.   3 tablet   0   . sulfamethoxazole-trimethoprim (SEPTRA DS) 800-160 MG per tablet   Oral   Take 1 tablet by mouth every 12 (twelve) hours.   10 tablet   0     BP 127/75  Pulse 68  Temp(Src) 98.2 F (36.8 C) (Oral)  Resp 20  SpO2 99%  LMP 03/27/2013  Breastfeeding? No  Physical Exam  Nursing note and vitals reviewed. Constitutional: She is oriented to person, place, and time. She appears well-developed and well-nourished. No distress.  HENT:  Head: Normocephalic and atraumatic.  Eyes: EOM are normal.  Neck: Normal range of motion.  Cardiovascular: Normal rate, regular rhythm and normal heart sounds.   Pulmonary/Chest: Effort normal and breath sounds normal.  Abdominal: Soft. She exhibits no distension. There is no tenderness.  Musculoskeletal: Normal range of motion.   Superior gluteal cleft with mild to moderate sized abscess with associated fluctuance and some drainage.  No surrounding erythema.  Neurological: She is alert and oriented to person, place, and time.  Skin: Skin is warm and dry.  Psychiatric: She has a normal mood and affect. Judgment normal.    ED Course  Procedures (including critical  care time)  INCISION AND DRAINAGE Performed by: Lyanne Co Consent: Verbal consent obtained. Risks and benefits: risks, benefits and alternatives were discussed Time out performed prior to procedure Type: abscess Body area: gluteal cleft overlying sacrum Anesthesia: local infiltration Incision was made with a scalpel. Local anesthetic: lidocaine 2 % without epinephrine Anesthetic total: 7 ml Complexity: complex Blunt dissection to break up loculations Drainage: purulent Drainage amount: Large  Packing material: None  Patient tolerance: Patient tolerated the procedure well with no immediate complications.     Labs Reviewed - No data to display No results found.   1. Pilonidal abscess       MDM  Pilonidal cyst with associated abscess.   Discharge home with antibiotics and bathtub soaks.  Understands return to the ER for new or worsening symptoms.        Lyanne Co, MD 04/03/13 [phone]

## 2013-04-03 NOTE — ED Notes (Signed)
Report 2 abscesses "in crack of my butt" lower one ruptured and smells bad

## 2013-04-16 ENCOUNTER — Other Ambulatory Visit (HOSPITAL_COMMUNITY)
Admission: RE | Admit: 2013-04-16 | Discharge: 2013-04-16 | Disposition: A | Discharge status: Home / Self Care | Payer: 59 | Source: Ambulatory Visit | Attending: Emergency Medicine | Admitting: Emergency Medicine

## 2013-04-16 ENCOUNTER — Emergency Department (INDEPENDENT_AMBULATORY_CARE_PROVIDER_SITE_OTHER)
Admission: EM | Admit: 2013-04-16 | Discharge: 2013-04-16 | Disposition: A | Discharge status: Home / Self Care | Payer: 59 | Source: Home / Self Care | Attending: Emergency Medicine | Admitting: Emergency Medicine

## 2013-04-16 ENCOUNTER — Encounter (HOSPITAL_COMMUNITY): Payer: Self-pay

## 2013-04-16 DIAGNOSIS — N760 Acute vaginitis: Secondary | ICD-10-CM | POA: Insufficient documentation

## 2013-04-16 DIAGNOSIS — Z113 Encounter for screening for infections with a predominantly sexual mode of transmission: Secondary | ICD-10-CM | POA: Insufficient documentation

## 2013-04-16 MED ORDER — METRONIDAZOLE 0.75 % VA GEL: 1.0000 | Freq: Two times a day (BID) | VAGINAL | Status: DC

## 2013-04-16 MED ORDER — FLUCONAZOLE 150 MG PO TABS: 150.0000 mg | ORAL_TABLET | Freq: Once | ORAL | Status: AC

## 2013-04-16 MED ORDER — METRONIDAZOLE 0.75 % VA GEL: 1.0000 | Freq: Every day | VAGINAL | Status: AC

## 2013-04-16 NOTE — ED Provider Notes (Addendum)
History     CSN: [id number]  Arrival date & time 04/16/13  1505   First MD Initiated Contact with Patient 04/16/13 1605      Chief Complaint  Patient presents with  . Vaginal Discharge    (Consider location/radiation/quality/duration/timing/severity/associated sxs/prior treatment) HPI Comments: Patient presents urgent care this evening complaining of vaginal discharge with an strong odor, patient feels that she occasionally gets to develop bacterial vaginosis after her menstrual periods. She also would like to be tested for sexually transmitted illnesses, but denies any pelvic pain or fevers. She does have some minimal degree of itchiness. And denies having taken any recent antibiotics within the last 2 weeks. Denies any urinary symptoms such as increased frequency pain or burning with urination.   Patient is a 32 y.o. female presenting with vaginal discharge.  Vaginal Discharge Quality:  Mucoid, malodorous and milky Severity:  Moderate Onset quality:  Sudden Timing:  Constant Progression:  Worsening Context: at rest   Context: not after intercourse and not after urination   Relieved by:  Nothing Ineffective treatments:  None tried Associated symptoms: no abdominal pain, no dysuria, no fever, no genital lesions, no nausea, no rash, no urinary hesitancy, no vaginal itching and no vomiting     Past Medical History  Diagnosis Date  . Abnormal Pap smear 2011    Past Surgical History  Procedure Laterality Date  . Breast surgery      reduction    Family History  Problem Relation Age of Onset  . Hypertension Mother   . Drug abuse Mother     recovering addict  . Hypertension Maternal Aunt   . Diabetes Paternal Aunt   . Hypertension Maternal Grandmother   . Diabetes Paternal Grandmother     History  Substance Use Topics  . Smoking status: Never Smoker   . Smokeless tobacco: Not on file  . Alcohol Use: No    OB History   Grav Para Term Preterm Abortions TAB SAB Ect  Mult Living   2 1 1  1 1    1       Review of Systems  Constitutional: Negative for fever and activity change.  Gastrointestinal: Negative for nausea, vomiting and abdominal pain.  Genitourinary: Positive for vaginal discharge. Negative for dysuria, hesitancy, urgency, frequency, flank pain, decreased urine volume, vaginal bleeding, enuresis, difficulty urinating, genital sores and pelvic pain.  Skin: Negative for rash.    Allergies  Review of patient's allergies indicates no known allergies.  Home Medications   Current Outpatient Rx  Name  Route  Sig  Dispense  Refill  . fluconazole (DIFLUCAN) 150 MG tablet   Oral   Take 1 tablet (150 mg total) by mouth once.   2 tablet   0   . HYDROcodone-acetaminophen (NORCO/VICODIN) 5-325 MG per tablet   Oral   Take 1 tablet by mouth every 4 (four) hours as needed for pain.   10 tablet   0   . metroNIDAZOLE (METROGEL VAGINAL) 0.75 % vaginal gel   Vaginal   Place 1 Applicatorful vaginally at bedtime.   70 g   0   . naproxen sodium (ANAPROX) 220 MG tablet   Oral   Take 440 mg by mouth 2 (two) times daily with a meal.         . sulfamethoxazole-trimethoprim (SEPTRA DS) 800-160 MG per tablet   Oral   Take 1 tablet by mouth every 12 (twelve) hours.   10 tablet   0  LMP 04/09/2013  Breastfeeding? No  Physical Exam  Nursing note and vitals reviewed. Constitutional: Vital signs are normal. She appears well-developed and well-nourished.  HENT:  Head: Normocephalic.  Eyes: Conjunctivae are normal. No scleral icterus.  Neck: Neck supple.  Abdominal: Normal appearance. There is no tenderness.  Musculoskeletal: She exhibits no tenderness.  Neurological: She is alert.  Skin: No rash noted. No erythema.    ED Course  Procedures (including critical care time)  Labs Reviewed  URINE CYTOLOGY ANCILLARY ONLY   No results found.   1. Vaginitis       MDM  Vaginal discharge post menses. Most likely consistent with  bacterial vaginosis. A sample was obtained for both STD screening and detection Gardnerella and candida. Rx for Diflucan Rx for Flagyl-  Patient agreed to be treated with empirically prior to test results.       Jimmie Molly, MD 04/16/13 1702  Jimmie Molly, MD 04/16/13 1610  Jimmie Molly, MD 04/19/13 [phone]

## 2013-04-16 NOTE — ED Notes (Signed)
Vaginal irritation x 2 days

## 2013-04-19 ENCOUNTER — Telehealth (HOSPITAL_COMMUNITY): Payer: Self-pay | Admitting: *Deleted

## 2013-04-19 MED ORDER — AZITHROMYCIN 250 MG PO TABS: 1000.0000 mg | ORAL_TABLET | Freq: Once | ORAL | Status: DC

## 2013-04-20 NOTE — ED Notes (Signed)
Gardnerella and Prevotella Bivia pos. Candida neg.  Pt. adequately treated with Metrogel vaginal. Michelle Duarte 04/20/2013

## 2013-04-20 NOTE — ED Notes (Signed)
GC neg., Chlamydia pos. Other tests pending. 6/10 Needs tx. Message to Dr. Ladon Applebaum for order.  He e-prescribed Zithromax to pt.'s pharmacy.  6/11 I called pt. and left a message.   She called back.  Pt. verified x 2 and given results. Pt. told where to get her Rx.  Pt. instructed to notify her partner, no sex for 1 week and to practice safe sex. Pt. told she can get HIV testing at the [facility]. STD clinic by appointment. I told her I would call if any further tx. needed.  [facility] form completed and faxed to the [facility] Department. Vassie Moselle 04/20/2013

## 2014-09-10 ENCOUNTER — Encounter (HOSPITAL_COMMUNITY): Payer: Self-pay

## 2015-01-17 DIAGNOSIS — I10 Essential (primary) hypertension: Secondary | ICD-10-CM | POA: Insufficient documentation

## 2015-01-17 DIAGNOSIS — IMO0002 Reserved for concepts with insufficient information to code with codable children: Secondary | ICD-10-CM | POA: Insufficient documentation

## 2015-02-11 ENCOUNTER — Other Ambulatory Visit: Payer: Self-pay | Admitting: Obstetrics and Gynecology

## 2015-02-12 LAB — CYTOLOGY - PAP

## 2015-02-20 ENCOUNTER — Ambulatory Visit (HOSPITAL_BASED_OUTPATIENT_CLINIC_OR_DEPARTMENT_OTHER)
Admission: RE | Admit: 2015-02-20 | Discharge: 2015-02-20 | Disposition: A | Discharge status: Home / Self Care | Payer: 59 | Source: Ambulatory Visit | Attending: Family Medicine | Admitting: Family Medicine

## 2015-02-20 ENCOUNTER — Encounter: Payer: Self-pay | Admitting: Family Medicine

## 2015-02-20 ENCOUNTER — Encounter (INDEPENDENT_AMBULATORY_CARE_PROVIDER_SITE_OTHER): Payer: Self-pay

## 2015-02-20 ENCOUNTER — Ambulatory Visit (INDEPENDENT_AMBULATORY_CARE_PROVIDER_SITE_OTHER): Payer: 59 | Admitting: Family Medicine

## 2015-02-20 VITALS — BP 125/83 | HR 65 | Ht 64.0 in | Wt 238.0 lb

## 2015-02-20 DIAGNOSIS — M25552 Pain in left hip: Secondary | ICD-10-CM | POA: Insufficient documentation

## 2015-02-20 NOTE — Patient Instructions (Signed)
I'm concerned you have a stress fracture of the left side of your pelvis. In meantime no running, walking for exercise, no squats, lunges, elliptical, leg press. Ok for swimming, cycling if these are not painful - upper body, core exercises where your feet aren't planted on the ground are ok too. I will call you the business day following the MRI to go over results and next steps.

## 2015-02-21 ENCOUNTER — Telehealth: Payer: Self-pay | Admitting: *Deleted

## 2015-02-21 DIAGNOSIS — M25552 Pain in left hip: Secondary | ICD-10-CM | POA: Insufficient documentation

## 2015-02-21 NOTE — Telephone Encounter (Signed)
Patient wanted to know if prescription ibuprofen would be better or just as good as taking a pain medication. Patient is not taking either at this time,but would like something for the pain.  MRI is not scheduled until 03-02-15.

## 2015-02-21 NOTE — Progress Notes (Addendum)
PCP: Dr. Ashby Dawes  Subjective:   HPI: Patient is a 34 y.o. female here for left hip pain.  Patient reports she's had worsening left hip pain for about 1 month that has worsened over that time. Has been working out more recently - believes it worsened the day she was on the treadmill going faster than usual. Feels tight. Has tried stretching, tylenol, heating pad. No known injury. No history of stress fracture.  Past Medical History  Diagnosis Date  . Abnormal Pap smear 2011    No current outpatient prescriptions on file prior to visit.   No current facility-administered medications on file prior to visit.    Past Surgical History  Procedure Laterality Date  . Breast surgery      reduction    No Known Allergies  History   Social History  . Marital Status: Single    Spouse Name: N/A  . Number of Children: N/A  . Years of Education: N/A   Occupational History  . Not on file.   Social History Main Topics  . Smoking status: Never Smoker   . Smokeless tobacco: Not on file  . Alcohol Use: No  . Drug Use: No  . Sexual Activity: Yes   Other Topics Concern  . Not on file   Social History Narrative    Family History  Problem Relation Age of Onset  . Hypertension Mother   . Drug abuse Mother     recovering addict  . Hypertension Maternal Aunt   . Diabetes Paternal Aunt   . Hypertension Maternal Grandmother   . Diabetes Paternal Grandmother     BP 125/83 mmHg  Pulse 65  Ht 5\' 4"  (1.626 m)  Wt 238 lb (107.956 kg)  BMI 40.83 kg/m2  LMP 02/17/2015  Review of Systems: See HPI above.    Objective:  Physical Exam:  Gen: NAD  Left hip/back: No gross deformity, scoliosis. No TTP greater trochanter.  Tenderness at ASIS.  No midline or bony TTP of back.  No other tenderness. FROM back and hip without pain. Strength LEs 5/5 all muscle groups except 5-/5 painful hip flexion.   Negative SLRs. Sensation intact to light touch bilaterally. Negative  logroll bilateral hips Negative fabers and piriformis stretches. Unable to perform hop test due to pain.   Assessment & Plan:  1. Left hip pain - radiographs negative.  Concerning that she has pain this severe, unable to do hop test, difficulty walking, and started with exercise.  Concerned about a pelvic stress fracture, less likely left hip stress fracture.  Advised we go ahead with MRI to assess as depending on location some of these require extended period of non weight bearing to heal.  Addendum:  MRI reviewed and discussed with patient.  No evidence stress fracture - she does have apparent gluteus medius tendinitis and mild trochanteric bursitis.  Also with mild-moderate hip DJD but exam not consistent with this.  Encouraged patient to go ahead with physical therapy - f/u in 4-6 weeks following start of PT.

## 2015-02-21 NOTE — Assessment & Plan Note (Signed)
radiographs negative.  Concerning that she has pain this severe, unable to do hop test, difficulty walking, and started with exercise.  Concerned about a pelvic stress fracture, less likely left hip stress fracture.  Advised we go ahead with MRI to assess as depending on location some of these require extended period of non weight bearing to heal.

## 2015-02-21 NOTE — Telephone Encounter (Signed)
Is it possible for her to go elsewhere for the MRI if she can get in sooner?  She could take either or both Ibuprofen 600mg  three times a day with food and/or a pain medication.  She would have to pick up something like Norco though.

## 2015-02-25 ENCOUNTER — Ambulatory Visit (INDEPENDENT_AMBULATORY_CARE_PROVIDER_SITE_OTHER): Payer: 59

## 2015-02-25 DIAGNOSIS — M7062 Trochanteric bursitis, left hip: Secondary | ICD-10-CM

## 2015-02-25 DIAGNOSIS — M7602 Gluteal tendinitis, left hip: Secondary | ICD-10-CM

## 2015-02-25 DIAGNOSIS — M25552 Pain in left hip: Secondary | ICD-10-CM

## 2015-02-27 NOTE — Addendum Note (Signed)
Addended by: Sherrie George F on: 02/27/2015 11:08 AM   Modules accepted: Orders

## 2015-03-02 ENCOUNTER — Ambulatory Visit (HOSPITAL_BASED_OUTPATIENT_CLINIC_OR_DEPARTMENT_OTHER): Payer: 59

## 2015-03-12 ENCOUNTER — Ambulatory Visit: Payer: 59 | Attending: Family Medicine | Admitting: Physical Therapy

## 2015-03-12 ENCOUNTER — Encounter: Payer: Self-pay | Admitting: Physical Therapy

## 2015-03-12 DIAGNOSIS — M67952 Unspecified disorder of synovium and tendon, left thigh: Secondary | ICD-10-CM | POA: Diagnosis not present

## 2015-03-12 DIAGNOSIS — R29898 Other symptoms and signs involving the musculoskeletal system: Secondary | ICD-10-CM | POA: Diagnosis not present

## 2015-03-12 DIAGNOSIS — M6289 Other specified disorders of muscle: Secondary | ICD-10-CM

## 2015-03-12 DIAGNOSIS — M25552 Pain in left hip: Secondary | ICD-10-CM | POA: Insufficient documentation

## 2015-03-12 DIAGNOSIS — R52 Pain, unspecified: Secondary | ICD-10-CM | POA: Diagnosis not present

## 2015-03-12 DIAGNOSIS — G729 Myopathy, unspecified: Secondary | ICD-10-CM | POA: Insufficient documentation

## 2015-03-12 NOTE — Patient Instructions (Signed)
Reducing Load   Copyright  VHI. All rights reserved.  BODY MECHANICS Tips Good body mechanics are important during activities of daily living. The practice of good body mechanics will: -help distribute weight throughout the skeleton in a more anatomically correct manner thus stimulating more normal forces on the bones, and encouraging stronger, healthier, denser bones. -reduce unnatural forces on bones, ligaments, joints and muscles and reduce risk of fracture, other injury or back pain. A WORD ON BODY POSITIONING: Sitting is the hardest position for the back. Lying on the back is the easiest. Standing, in good body alignment, is somewhere between. A good motto is: Sit less, stand more, and, when you can't do that, lie down on your back and exercise to strengthen it.  Copyright  VHI. All rights reserved.       Supine to Sit (Active)   Lie on back, left leg bent. Roll to other side. From side-lying, sit up on side of bed. Complete ___ sets of ___ repetitions. Perform ___ sessions per day.  Copyright  VHI. All rights reserved.    Housework - Reaching Down   If you are unable to bend your knees or squat, use a lazy Manuela Schwartz to keep items within easy reach. Store only light, unbreakable items on the lowest shelves, and use a reacher to pick them up.  Copyright  VHI. All rights reserved.  Low Shelf   Squat down, and bring item close to lift.   Copyright  VHI. All rights reserved.  Lifting Principles .Maintain proper posture and head alignment. .Slide object as close as possible before lifting. .Move obstacles out of the way. .Test before lifting; ask for help if too heavy. .Tighten stomach muscles without holding breath. .Use smooth movements; do not jerk. .Use legs to do the work, and pivot with feet. .Distribute the work load symmetrically and close to the center of trunk. .Push instead of pull whenever possible.  Copyright  VHI. All rights reserved.  Posture -  Standing   Good posture is important. Avoid slouching and forward head thrust. Maintain curve in low back and align ears over shoul- ders, hips over ankles.   Copyright  VHI. All rights reserved.   Posture - Sitting   Sit upright, head facing forward. Try using a roll to support lower back. Keep shoulders relaxed, and avoid rounded back. Keep hips level with knees. Avoid crossing legs for long periods.   Copyright  VHI. All rights reserved.  Ideal Posture Use with figures on 3 (2 of 2): 1.Head erect 2.Chin in 3.Chest and navel aligned 4.Spinal curves maintained 5.Knees relaxed 6.Shoulders and hips aligned 7.Feet slightly apart 8.Toes and arches active 9.Abdomen taut (breathe with diaphragm) 10.Arms at sides Ideal posture is: -pain free. -achieved with practice, mindful interest, and body awareness.  Copyright  VHI. All rights reserved.     Move heavy items one at a time, or move portions of the contents.   Posture Awareness     Stand and check posture: Jut chin, pull back to comfortable position. Tilt pelvis forward, back; be sure back is not swayed. Roll from heels to balls of feet, then distribute your weight evenly. Picture a line through spine pulling you erect. Focus on breathing. Good Posture = Better Breathing. Check __5__ times per day.  http://gt2.exer.us/873   Copyright  VHI. All rights reserved.  Piriformis Stretch, Standing: Yoga Crossed Leg Chair   Stand on one leg, other leg crossed over thigh of standing leg. Bend standing leg, sitting back  as if in chair. Reach arms down sides. Hold _30__ seconds.  Repeat _3__ times per session. Do _2-3__ sessions per day.  Copyright  VHI. All rights reserved.   Piriformis Stretch   Lying on back, pull right knee toward opposite shoulder. Hold _30___ seconds. Repeat _3___ times. Do _2-3___ sessions per day.  http://gt2.exer.us/258   Copyright  VHI. All rights reserved.

## 2015-03-12 NOTE — Therapy (Signed)
[place], [place], [address] Phone: [phone]   Fax:  [phone]  Physical Therapy Evaluation  Patient Details  Name: Michelle Duarte MRN: [id number] Date of Birth: September 27, 1981 Referring Provider:  Dene Gentry, MD  Encounter Date: 03/12/2015      PT End of Session - 03/12/15 1613    Visit Number 1   Number of Visits 12   Date for PT Re-Evaluation 04/23/15   PT Start Time 1427   PT Stop Time 1530   PT Time Calculation (min) 63 min   Activity Tolerance Patient tolerated treatment well;No increased pain   Behavior During Therapy [facility] for tasks assessed/performed      Past Medical History  Diagnosis Date  . Abnormal Pap smear 2011    Past Surgical History  Procedure Laterality Date  . Breast surgery      reduction    There were no vitals filed for this visit.  Visit Diagnosis:  Hip pain, acute, left  Weakness of left hip  Tendinopathy of left gluteus medius  Pain aggravated by walking  Muscle tightness      Subjective Assessment - 03/12/15 1432    Subjective Pt states that she started working out in March speed walking on a tread mill, pain started and never went away. At some point it got so severe that she could not step on it and  felt like something was"stuck into her hip". Pain is less severe now, but continues to bother her and interferes with her job, ADLs and prevents her from exercising.       Limitations Sitting;Walking;House hold activities  Job-related activities    How long can you sit comfortably? can sit for a long time, but pain increases when she is trying to get up   How long can you stand comfortably? not sure, but not very long   How long can you walk comfortably? not sure, but not very long   Diagnostic tests X-ray - negative, MRI - glut med tendinopathy, mild B hip bursitis   Patient Stated Goals relieve pain, go back to walking and exercising without pain   Currently in Pain? Yes   Pain Score 2    Pain Location Hip   Pain Orientation Left   Pain Descriptors / Indicators Sore   Pain Type Acute pain  1.5 months   Pain Onset More than a month ago   Pain Frequency Intermittent   Aggravating Factors  Sit for a long time and standing up, changing positions at night, exercising   Pain Relieving Factors Medication, heat, ice, stretching   Effect of Pain on Daily Activities Limited   Multiple Pain Sites No          [facility] PT Assessment - 03/12/15 1639    Assessment   Medical Diagnosis Lt. hip pain   Onset Date 01/22/15  approximately per pt   Prior Therapy None   Precautions   Precautions None   Restrictions   Weight Bearing Restrictions No   Balance Screen   Has the patient fallen in the past 6 months No   Home Environment   Living Enviornment Private residence   Prior Function   Level of Independence Independent with basic ADLs   Cognition   Overall Cognitive Status Within Functional Limits for tasks assessed   Observation/Other Assessments   Observations Obese deconditioned female, will benefit from general strengthengthening program and regular exercise routine     Skin Integrity WNL  Focus on Therapeutic Outcomes (FOTO)  Not assessed   Sensation   Light Touch Appears Intact   Coordination   Gross Motor Movements are Fluid and Coordinated Yes   Squat   Comments WFL   Posture/Postural Control   Posture/Postural Control Postural limitations   Postural Limitations Rounded Shoulders  In sitting   Posture Comments Poor body mechanics for bending and picking objects of the floor   AROM   Overall AROM  Within functional limits for tasks performed   AROM Assessment Site Hip;Knee   Right Hip Extension --  WFL   Right Hip Flexion --  WFL   Right Hip External Rotation  --  WFL   Right Hip Internal Rotation  --  WFL   Right Hip ABduction --  WFL   Left Hip Extension --  WFL   Left Hip Flexion --  WFL   Left Hip External  Rotation  --  [facility]   Left Hip Internal Rotation  --  WFL, Lt > limited than Rt   Left Hip ABduction --  [facility]   Right/Left Knee --  both knees WNL   PROM   Overall PROM  Within functional limits for tasks performed   PROM Assessment Site --  Firm end feel, no pain at end range movements   Strength   Overall Strength Deficits   Right Hip Flexion 4+/5   Right Hip Extension 4/5   Right Hip ABduction 4/5   Left Hip Flexion 4-/5   Left Hip Extension 4-/5   Left Hip ABduction 4-/5   Right Knee Flexion 5/5   Right Knee Extension 5/5   Left Knee Flexion 5/5   Left Knee Extension 4/5   Flexibility   Hamstrings tight Bilat at 70 deg   Piriformis Lt > Rt   Palpation   Palpation No TTP   Special Tests    Special Tests Hip Special Tests   Hip Special Tests  Saralyn Pilar (FABER) Test   Saralyn Pilar [facility]) Test   Findings Negative   Side Left   Ambulation/Gait   Ambulation/Gait --  No noticable gait abnormalities          [facility] Adult PT Treatment/Exercise - 03/12/15 1639    Knee/Hip Exercises: Stretches   Piriformis Stretch 4 reps;30 seconds  standing and supine   Knee/Hip Exercises: Aerobic   Stationary Bike 12 minutes, resistance level 4  pt tolerates bike well, does not tolerate walking -incr pain           PT Education - 03/12/15 1557    Education provided Yes   Education Details HEP: posture, body mechanics, piriformis stretches supine and standing pigeon   Person(s) Educated Patient   Methods Explanation;Demonstration;Tactile cues;Verbal cues;Handout   Comprehension Verbalized understanding;Returned demonstration;Verbal cues required          PT Short Term Goals - 03/12/15 1559    PT SHORT TERM GOAL #1   Title STGs = LTGs           PT Long Term Goals - 03/12/15 1600    PT LONG TERM GOAL #1   Title Pt will be I with HEP for her hip, stretngthening, stretching, and endurance    Baseline Exercising hurts, especially treadmill walking/jogging   Time 6   Period  Weeks   Status New   PT LONG TERM GOAL #2   Title Pt will be able to walk for 45-60 minutes without lasting increase in pain    Baseline Walking increases pain  Time 6   Period Weeks   Status New   PT LONG TERM GOAL #3   Title Pt's Lt hip strength will increase to 4+/5 (flexion, abduction, extension) to provide stability and support during walking    Baseline currently 4-/5   Time 6   Period Weeks   Status New   PT LONG TERM GOAL #4   Title Pt will join [employer] and will be able to exercise 3 x week to strengthen weak muscles, lose wight, and increase endurance    Time 6   Period Weeks   Status New   PT LONG TERM GOAL #5   Title Pt will report and demo good posture and body mechanics without cues from PT/PTA    Time 6   Period Weeks   Status New           Plan - 03/12/15 1614    Clinical Impression Statement Pt presents to [facility] with Lt hip bursitis, Lt gluteus medius tindinopathy and pain rated at 2/10 at this moment. Pt states that the onset of pain was after treadmill walking/jogging some time in March. Pt performs self stretches (piriformis) for relief of pain. Monetta is limited in sitting for long periods of time, she cannot exercise because she is afraid to aggravate the pain, and she is limited in walking secondary to increase in hip pain. Pt will benefit from skilled PT to improve strength of Lt hip and knee muscles, stretch tight musculature, decrease pain, correct posture and body mechanics, and increase endurance for pt to be able to go back to exercising, lose weight, and  prevent potential hip and back injury.        Pt will benefit from skilled therapeutic intervention in order to improve on the following deficits Impaired flexibility;Improper body mechanics;Decreased endurance;Postural dysfunction;Obesity;Pain;Increased fascial restricitons;Decreased activity tolerance;Decreased mobility;Decreased strength   Rehab Potential Excellent   PT Frequency 2x / week   PT  Duration 6 weeks   PT Treatment/Interventions ADLs/Self Care Home Management;Moist Heat;Therapeutic activities;Patient/family education;Other (comment);Passive range of motion;Therapeutic exercise;Ultrasound;Balance training;Cryotherapy;Electrical Stimulation;Functional mobility training  iontophoresis   PT Next Visit Plan Bike to warm up, piriformis stretches, Lt hip gentle strengthening thoughout, modality for pain (e-stim, MHP). Assess HEP and add hip srengthening exercises, maybe STW and tape.    PT Home Exercise Plan HEP: posture, body mechanics, piriformis stretch supine and standing.    Consulted and Agree with Plan of Care Patient         Problem List Patient Active Problem List   Diagnosis Date Noted  . Left hip pain 02/21/2015  . Vaginal delivery 08/06/2011  . CONSTIPATION 01/19/2008    Ileana Roup 03/12/2015, 5:18 PM  [facility] [address] [place], [place], [address] Phone: [phone]   Fax:  [phone]

## 2015-03-19 ENCOUNTER — Ambulatory Visit: Payer: 59 | Admitting: Physical Therapy

## 2015-03-19 DIAGNOSIS — M25552 Pain in left hip: Secondary | ICD-10-CM | POA: Diagnosis not present

## 2015-03-19 DIAGNOSIS — R52 Pain, unspecified: Secondary | ICD-10-CM

## 2015-03-19 DIAGNOSIS — M6289 Other specified disorders of muscle: Secondary | ICD-10-CM

## 2015-03-19 DIAGNOSIS — R29898 Other symptoms and signs involving the musculoskeletal system: Secondary | ICD-10-CM

## 2015-03-19 DIAGNOSIS — M67952 Unspecified disorder of synovium and tendon, left thigh: Secondary | ICD-10-CM

## 2015-03-19 NOTE — Therapy (Signed)
[place], [place], [address] Phone: [phone]   Fax:  [phone]  Physical Therapy Treatment  Patient Details  Name: Michelle Duarte MRN: [id number] Date of Birth: 08/16/1981 Referring Provider:  Dene Gentry, MD  Encounter Date: 03/19/2015      PT End of Session - 03/19/15 1729    Visit Number 2   Number of Visits 12   Date for PT Re-Evaluation 04/23/15   PT Start Time 4967   PT Stop Time 5916   PT Time Calculation (min) 53 min   Activity Tolerance Patient tolerated treatment well      Past Medical History  Diagnosis Date  . Abnormal Pap smear 2011    Past Surgical History  Procedure Laterality Date  . Breast surgery      reduction    There were no vitals filed for this visit.  Visit Diagnosis:  Hip pain, acute, left  Weakness of left hip  Tendinopathy of left gluteus medius  Pain aggravated by walking  Muscle tightness      Subjective Assessment - 03/19/15 1648    Subjective (p) Patient arrives 15 min late.  Reports left hip/buttock is uncomfortable.  States she lost her HEP from last visit.  Haven't walked in [facility] since injury.     Currently in Pain? (p) Yes   Pain Score (p) 3    Pain Location (p) Hip   Aggravating Factors  (p) sitting for a while and rising; 2-3 miles walking                         [facility] Adult PT Treatment/Exercise - 03/19/15 1726    Knee/Hip Exercises: Stretches   Piriformis Stretch 2 reps;20 seconds   Knee/Hip Exercises: Sidelying   Clams 10x   Modalities   Modalities Moist Heat   Moist Heat Therapy   Number Minutes Moist Heat 12 Minutes   Moist Heat Location --  left hip in sidelying   Manual Therapy   Manual Therapy Joint mobilization;Myofascial release   Joint Mobilization Left hip long axis distraction; inferior mobs with belt; AP in IR; prone PA. PA in IR and ER; subtalar distraction 3x 20 sec each grade 3   Myofascial Release left gluteals, piriformis, ITB          Trigger Point Dry Needling - 03/19/15 1728    Consent Given? Yes   Education Handout Provided Yes   Muscles Treated Lower Body Gluteus maximus;Tensor fascia lata;Quadriceps   Gluteus Maximus Response Palpable increased muscle length   Tensor Fascia Lata Response Twitch response elicited;Palpable increased muscle length   Quadriceps Response Palpable increased muscle length  left vastus lateralis    Left side only.          PT Education - 03/19/15 1724    Education provided Yes   Education Details clams for glut med strengthening;  self care following dry needling   Person(s) Educated Patient   Methods Explanation;Demonstration;Handout   Comprehension Verbalized understanding;Returned demonstration          PT Short Term Goals - 03/19/15 1733    PT SHORT TERM GOAL #1   Title STGs = LTGs           PT Long Term Goals - 03/19/15 1733    PT LONG TERM GOAL #1   Title Pt will be I with HEP for her hip, stretngthening, stretching, and endurance    Time  6   Period Weeks   Status On-going   PT LONG TERM GOAL #2   Title Pt will be able to walk for 45-60 minutes without lasting increase in pain    Time 6   Period Weeks   Status On-going   PT LONG TERM GOAL #3   Title Pt's Lt hip strength will increase to 4+/5 (flexion, abduction, extension) to provide stability and support during walking    Time 6   Period Weeks   Status On-going   PT LONG TERM GOAL #4   Title Pt will join [employer] and will be able to exercise 3 x week to strengthen weak muscles, lose wight, and increase endurance    Time 6   Period Weeks   Status On-going   PT LONG TERM GOAL #5   Title Pt will report and demo good posture and body mechanics without cues from PT/PTA    Time 6   Period Weeks   Status On-going               Plan - 03/19/15 1730    Clinical Impression Statement Patient reports primary pain in left vastus lateralis  with trigger points identified.  Patient receptive to dry needling and manual interventions and was instructed in typical post treatment symptoms including initial soreness.  Encouraged home stretching and strengthening for better long term results.  Therapist closely monitoring response with all interventions.     PT Next Visit Plan assess response to dry needling and manual interventions;  assess carryover with glut med strengthening and piriformis stretching HEP;  modalities for pain as needed        Problem List Patient Active Problem List   Diagnosis Date Noted  . Left hip pain 02/21/2015  . Vaginal delivery 08/06/2011  . CONSTIPATION 01/19/2008    Alvera Singh 03/19/2015, 5:35 PM  [facility] [address] [place], [place], [address] Phone: [phone]   Fax:  [phone]   Ruben Im, PT 03/19/2015 5:35 PM Phone: [phone] Fax: [phone]

## 2015-03-19 NOTE — Patient Instructions (Signed)
Trigger Point Dry Needling  . What is Trigger Point Dry Needling (DN)? o DN is a physical therapy technique used to treat muscle pain and dysfunction. Specifically, DN helps deactivate muscle trigger points (muscle knots).  o A thin filiform needle is used to penetrate the skin and stimulate the underlying trigger point. The goal is for a local twitch response (LTR) to occur and for the trigger point to relax. No medication of any kind is injected during the procedure.   . What Does Trigger Point Dry Needling Feel Like?  o The procedure feels different for each individual patient. Some patients report that they do not actually feel the needle enter the skin and overall the process is not painful. Very mild bleeding may occur. However, many patients feel a deep cramping in the muscle in which the needle was inserted. This is the local twitch response.   Marland Kitchen How Will I feel after the treatment? o Soreness is normal, and the onset of soreness may not occur for a few hours. Typically this soreness does not last longer than two days.  o Bruising is uncommon, however; ice can be used to decrease any possible bruising.  o In rare cases feeling tired or nauseous after the treatment is normal. In addition, your symptoms may get worse before they get better, this period will typically not last longer than 24 hours.   . What Can I do After My Treatment? o Increase your hydration by drinking more water for the next 24 hours. o You may place ice or heat on the areas treated that have become sore, however, do not use heat on inflamed or bruised areas. Heat often brings more relief post needling. o You can continue your regular activities, but vigorous activity is not recommended initially after the treatment for 24 hours. o DN is best combined with other physical therapy such as strengthening, stretching, and other therapies.  Abduction: Clam (Eccentric) - Side-Lying   Lie on side with knees bent. Lift top  knee, keeping feet together. Keep trunk steady. Slowly lower for 3-5 seconds. 15__ reps per set, _1__ sets per day, _7__ days per week. Add ___ lbs when you achieve ___ repetitions.  Copyright  VHI. All rights reserved.   Also, single leg standing.  Watch for pelvic drop.  Hands on hips should be level.    Continue piriformis stretching as previous.

## 2015-03-25 ENCOUNTER — Ambulatory Visit: Payer: 59 | Admitting: Physical Therapy

## 2015-03-27 ENCOUNTER — Encounter: Payer: Self-pay | Admitting: Physical Therapy

## 2015-03-27 ENCOUNTER — Ambulatory Visit: Payer: 59 | Admitting: Physical Therapy

## 2015-03-27 DIAGNOSIS — R29898 Other symptoms and signs involving the musculoskeletal system: Secondary | ICD-10-CM

## 2015-03-27 DIAGNOSIS — M67952 Unspecified disorder of synovium and tendon, left thigh: Secondary | ICD-10-CM

## 2015-03-27 DIAGNOSIS — M6289 Other specified disorders of muscle: Secondary | ICD-10-CM

## 2015-03-27 DIAGNOSIS — M25552 Pain in left hip: Secondary | ICD-10-CM

## 2015-03-27 DIAGNOSIS — R52 Pain, unspecified: Secondary | ICD-10-CM

## 2015-03-27 NOTE — Therapy (Signed)
[place], [place], [address] Phone: [phone]   Fax:  [phone]  Physical Therapy Treatment  Patient Details  Name: Michelle Duarte MRN: [id number] Date of Birth: July 19, 1981 Referring Provider:  Dene Gentry, MD  Encounter Date: 03/27/2015      PT End of Session - 03/27/15 1532    Visit Number 3   Number of Visits 12   Date for PT Re-Evaluation 04/23/15   PT Start Time 9381  15 late   PT Stop Time 1531   PT Time Calculation (min) 57 min   Activity Tolerance Patient tolerated treatment well      Past Medical History  Diagnosis Date  . Abnormal Pap smear 2011    Past Surgical History  Procedure Laterality Date  . Breast surgery      reduction    There were no vitals filed for this visit.  Visit Diagnosis:  Tendinopathy of left gluteus medius  Hip pain, acute, left  Weakness of left hip  Pain aggravated by walking  Muscle tightness      Subjective Assessment - 03/27/15 1453    Subjective Reports doing less to avoid pain and walking with more stiffness                         [facility] Adult PT Treatment/Exercise - 03/27/15 1504    Knee/Hip Exercises: Stretches   Active Hamstring Stretch 2 reps;30 seconds   Quad Stretch 2 reps;30 seconds   Hip Flexor Stretch 1 rep;30 seconds   ITB Stretch 2 reps;30 seconds   Knee/Hip Exercises: Aerobic   Stationary Bike 5 min level 2    Knee/Hip Exercises: Standing   Other Standing Knee Exercises standing lateral side/hip/ITB stretch for L hip, took picture for pt reference   Knee/Hip Exercises: Supine   Bridges Strengthening;Both;1 set;10 reps   Knee/Hip Exercises: Sidelying   Other Sidelying Knee Exercises sidelying over bolster for L trunk/hip stretch    Bridging progression: articulating x 10 Single leg x 5 each LE Bridge with clam x10        PT Education - 03/27/15 1531    Education provided Yes   Education  Details anatomy, HEP, TFL/ITB, OK to walk, exercise   Person(s) Educated Patient   Methods Explanation;Demonstration;Handout   Comprehension Verbalized understanding;Returned demonstration          PT Short Term Goals - 03/19/15 1733    PT SHORT TERM GOAL #1   Title STGs = LTGs           PT Long Term Goals - 03/27/15 2146    PT LONG TERM GOAL #1   Title Pt will be I with HEP for her hip, strengthening, stretching, and endurance    Status On-going   PT LONG TERM GOAL #2   Title Pt will be able to walk for 45-60 minutes without lasting increase in pain    Status On-going   PT LONG TERM GOAL #3   Title Pt's Lt hip strength will increase to 4+/5 (flexion, abduction, extension) to provide stability and support during walking    PT LONG TERM GOAL #4   Title Pt will return to normal exercise structured routine 3 x week to strengthen weak muscles, lose wight, and increase endurance    Status On-going   PT LONG TERM GOAL #5   Title Pt will report and demo good posture and body mechanics without cues from PT/PTA  Status On-going               Plan - 03/27/15 1532    Clinical Impression Statement Pt with pain at L TFL, repsonded well to dry needling and today to ITB/doorway stretching.  Given core routine for home.  No goals met. Walking more naturally after treatment.    PT Next Visit Plan assess response to dry needling and manual interventions;  cont with core, lateral hip stretch (not piriformis)  modalities for pain as needed   PT Home Exercise Plan added bridge, ITB and standing lateral hip stretch   Consulted and Agree with Plan of Care Patient        Problem List Patient Active Problem List   Diagnosis Date Noted  . Left hip pain 02/21/2015  . Vaginal delivery 08/06/2011  . CONSTIPATION 01/19/2008    PAA,JENNIFER 03/27/2015, 9:49 PM  [facility] [address] [place], [place], [address] Phone:  [phone]   Fax:  [phone]    Raeford Razor, PT 03/27/2015 9:51 PM Phone: [phone] Fax: [phone]

## 2015-04-02 ENCOUNTER — Ambulatory Visit: Payer: 59 | Admitting: Physical Therapy

## 2015-04-04 ENCOUNTER — Ambulatory Visit: Payer: 59 | Admitting: Physical Therapy

## 2015-04-04 DIAGNOSIS — M67952 Unspecified disorder of synovium and tendon, left thigh: Secondary | ICD-10-CM

## 2015-04-04 DIAGNOSIS — M25552 Pain in left hip: Secondary | ICD-10-CM | POA: Diagnosis not present

## 2015-04-04 DIAGNOSIS — M6289 Other specified disorders of muscle: Secondary | ICD-10-CM

## 2015-04-04 DIAGNOSIS — R52 Pain, unspecified: Secondary | ICD-10-CM

## 2015-04-04 DIAGNOSIS — R29898 Other symptoms and signs involving the musculoskeletal system: Secondary | ICD-10-CM

## 2015-04-04 NOTE — Therapy (Signed)
[place], [place], [address] Phone: [phone]   Fax:  [phone]  Physical Therapy Treatment  Patient Details  Name: Michelle Duarte MRN: [id number] Date of Birth: [birth year] Referring Provider:  Dene Gentry, MD  Encounter Date: 04/04/2015      PT End of Session - 04/04/15 1447    Visit Number 4   Number of Visits 12   Date for PT Re-Evaluation 04/23/15   PT Start Time 1116  15 minlate   PT Stop Time 1148   PT Time Calculation (min) 32 min   Activity Tolerance Patient tolerated treatment well      Past Medical History  Diagnosis Date  . Abnormal Pap smear 2011    Past Surgical History  Procedure Laterality Date  . Breast surgery      reduction    There were no vitals filed for this visit.  Visit Diagnosis:  Hip pain, acute, left  Tendinopathy of left gluteus medius  Weakness of left hip  Pain aggravated by walking  Muscle tightness      Subjective Assessment - 04/04/15 1123    Subjective Arr 15 min late. I don't want to do anything. I've been doing my exercises. Wore heels this weekend and i'm still sore.    Currently in Pain? Yes   Pain Score 5    Pain Location Hip   Pain Orientation Left;Lateral;Anterior   Pain Descriptors / Indicators Sore   Pain Type Chronic pain   Pain Onset More than a month ago   Pain Frequency Intermittent             [facility] Adult PT Treatment/Exercise - 04/04/15 1448    Knee/Hip Exercises: Stretches   ITB Stretch 3 reps;30 seconds   Knee/Hip Exercises: Supine   Other Supine Knee Exercises TFL stretch (knees crossed, rotate away) x 3 , 30 sec   Ultrasound   Ultrasound Location L TFL and ITB (prox)   Ultrasound Parameters 50% duty, 1.0 W/cm2, 8 min with biofreeze   Ultrasound Goals Pain   Manual Therapy   Myofascial Release L lateral/ant hip in sidelying                PT Education - 04/04/15 1446    Education provided Yes    Education Details tennis ball for self care, TFL, ultrasound   Person(s) Educated Patient   Methods Explanation;Demonstration;Handout   Comprehension Verbalized understanding;Returned demonstration          PT Short Term Goals - 03/19/15 1733    PT SHORT TERM GOAL #1   Title STGs = LTGs           PT Long Term Goals - 03/27/15 2146    PT LONG TERM GOAL #1   Title Pt will be I with HEP for her hip, strengthening, stretching, and endurance    Status On-going   PT LONG TERM GOAL #2   Title Pt will be able to walk for 45-60 minutes without lasting increase in pain    Status On-going   PT LONG TERM GOAL #3   Title Pt's Lt hip strength will increase to 4+/5 (flexion, abduction, extension) to provide stability and support during walking    PT LONG TERM GOAL #4   Title Pt will return to normal exercise structured routine 3 x week to strengthen weak muscles, lose wight, and increase endurance    Status On-going   PT LONG TERM GOAL #5  Title Pt will report and demo good posture and body mechanics without cues from PT/PTA    Status On-going               Plan - 04/04/15 1447    Clinical Impression Statement Cont to have pain, resistance to walking.  Reports fear of aggravating hip pain.  Stretches help and she does regularly.  Did not formally check goals to due time constraints.    PT Next Visit Plan assess response to dry needling and manual interventions;  cont with core, lateral hip stretch (not piriformis)  modalities for pain as needed   PT Home Exercise Plan added bridge, ITB and standing lateral hip stretch   Consulted and Agree with Plan of Care Patient        Problem List Patient Active Problem List   Diagnosis Date Noted  . Left hip pain 02/21/2015  . Vaginal delivery 08/06/2011  . CONSTIPATION 01/19/2008    PAA,JENNIFER 04/04/2015, 2:51 PM  [facility] [address] [place], [place],  [address] Phone: [phone]   Fax:  [phone]   Raeford Razor, PT 04/04/2015 2:52 PM Phone: [phone] Fax: [phone]

## 2015-04-09 ENCOUNTER — Ambulatory Visit: Payer: 59 | Admitting: Physical Therapy

## 2015-04-09 DIAGNOSIS — M25552 Pain in left hip: Secondary | ICD-10-CM | POA: Diagnosis not present

## 2015-04-09 DIAGNOSIS — R52 Pain, unspecified: Secondary | ICD-10-CM

## 2015-04-09 DIAGNOSIS — R29898 Other symptoms and signs involving the musculoskeletal system: Secondary | ICD-10-CM

## 2015-04-09 DIAGNOSIS — M67952 Unspecified disorder of synovium and tendon, left thigh: Secondary | ICD-10-CM

## 2015-04-09 DIAGNOSIS — M6289 Other specified disorders of muscle: Secondary | ICD-10-CM

## 2015-04-09 NOTE — Therapy (Addendum)
[place], [place], [address] Phone: [phone]   Fax:  [phone]  Physical Therapy Treatment  Patient Details  Name: Michelle Duarte MRN: [id number] Date of Birth: [birth year] Referring Provider:  Dene Gentry, MD  Encounter Date: 04/09/2015      PT End of Session - 04/09/15 1429    Visit Number 5   Number of Visits 12   Date for PT Re-Evaluation 04/23/15   PT Start Time 6160   PT Stop Time 1512   PT Time Calculation (min) 54 min   Activity Tolerance Patient tolerated treatment well      Past Medical History  Diagnosis Date  . Abnormal Pap smear 2011    Past Surgical History  Procedure Laterality Date  . Breast surgery      reduction    There were no vitals filed for this visit.  Visit Diagnosis:  Hip pain, acute, left  Tendinopathy of left gluteus medius  Weakness of left hip  Pain aggravated by walking  Muscle tightness      Subjective Assessment - 04/09/15 1419    Subjective [place], [place] pace. L hip gave out a bit, very sore.  Did stretches today, been using the tennis ball alot.    Currently in Pain? No/denies  only when patient abducts hip in standing        Pilates Reformer used for LE/core strength, postural strength, lumbopelvic disassociation and core control.  Exercises included: Footwork 2 Red 1 Blue parallel/heels Bridging 2 Red 1 Blue x10  Clam with blue band x20  Bridge and band x 8  Feet in Straps 1 Red 1 yellow for arcs, circles Reverse Abdominals 1 yellow shoulders x 8 then lower body x 10- very challenging         PT Education - 04/09/15 1438    Education provided Yes   Education Details Reformer/Pilates   Person(s) Educated Patient   Methods Explanation;Demonstration   Comprehension Verbalized understanding;Tactile cues required;Need further instruction          PT Short Term Goals - 03/19/15 1733    PT SHORT TERM  GOAL #1   Title STGs = LTGs           PT Long Term Goals - 04/09/15 1629    PT LONG TERM GOAL #1   Title Pt will be I with HEP for her hip, strengthening, stretching, and endurance    Status On-going   PT LONG TERM GOAL #2   Title Pt will be able to walk for 45-60 minutes without lasting increase in pain    Status On-going   PT LONG TERM GOAL #3   Title Pt's Lt hip strength will increase to 4+/5 (flexion, abduction, extension) to provide stability and support during walking    Status On-going   PT LONG TERM GOAL #4   Title Pt will return to normal exercise structured routine 3 x week to strengthen weak muscles, lose wight, and increase endurance    Status On-going   PT LONG TERM GOAL #5   Title Pt will report and demo good posture and body mechanics without cues from PT/PTA    Status On-going               Plan - 04/09/15 1630    Clinical Impression Statement Patient had less pain overall, was able to do basic core exercises on Reformer without pain aggravation.  Her pain is in the  L TFL and overall LLE fatigues quickly.  No further goals met although she is tolerating more.    PT Next Visit Plan assess response to dry needling and manual interventions;  cont with core, lateral hip stretch (not piriformis)  modalities for pain as needed   PT Home Exercise Plan added bridge, ITB and standing lateral hip stretch   Consulted and Agree with Plan of Care Patient       PHYSICAL THERAPY DISCHARGE SUMMARY  Visits from Start of Care: 5  Current functional level related to goals / functional outcomes: See clinical impressions above.  The patient no-showed for last several appts.  Her chart has been inactive for several months.  Will discharge from PT at this time with no goals met.  Remaining deficits: See above   Education / Equipment: Initial HEP Plan: Patient agrees to discharge.  Patient goals were not met. Patient is being discharged due to not returning since the  last visit.  ?????   Ruben Im, PT 07/30/2015 4:36 PM Phone: [phone] Fax: [phone]     Problem List Patient Active Problem List   Diagnosis Date Noted  . Left hip pain 02/21/2015  . Vaginal delivery 08/06/2011  . CONSTIPATION 01/19/2008    PAA,JENNIFER 04/09/2015, 4:37 PM  [facility] [address] [place], [place], [address] Phone: [phone]   Fax:  [phone]   Raeford Razor, PT 04/09/2015 4:37 PM Phone: [phone] Fax: [phone]

## 2015-04-11 ENCOUNTER — Encounter: Payer: 59 | Admitting: Physical Therapy

## 2015-04-16 ENCOUNTER — Ambulatory Visit: Payer: 59 | Attending: Family Medicine | Admitting: Physical Therapy

## 2015-04-18 ENCOUNTER — Ambulatory Visit: Payer: 59 | Admitting: Physical Therapy

## 2015-04-24 ENCOUNTER — Encounter (HOSPITAL_COMMUNITY): Payer: Self-pay | Admitting: *Deleted

## 2015-04-24 ENCOUNTER — Inpatient Hospital Stay (HOSPITAL_COMMUNITY)
Admission: AD | Admit: 2015-04-24 | Discharge: 2015-04-24 | Disposition: A | Discharge status: Home / Self Care | Payer: 59 | Source: Ambulatory Visit | Attending: Obstetrics and Gynecology | Admitting: Obstetrics and Gynecology

## 2015-04-24 DIAGNOSIS — Z3A01 Less than 8 weeks gestation of pregnancy: Secondary | ICD-10-CM | POA: Insufficient documentation

## 2015-04-24 DIAGNOSIS — B373 Candidiasis of vulva and vagina: Secondary | ICD-10-CM | POA: Diagnosis not present

## 2015-04-24 DIAGNOSIS — Z87891 Personal history of nicotine dependence: Secondary | ICD-10-CM | POA: Diagnosis not present

## 2015-04-24 DIAGNOSIS — B3731 Acute candidiasis of vulva and vagina: Secondary | ICD-10-CM

## 2015-04-24 DIAGNOSIS — O98811 Other maternal infectious and parasitic diseases complicating pregnancy, first trimester: Secondary | ICD-10-CM | POA: Diagnosis not present

## 2015-04-24 DIAGNOSIS — L293 Anogenital pruritus, unspecified: Secondary | ICD-10-CM | POA: Diagnosis present

## 2015-04-24 HISTORY — DX: Essential (primary) hypertension: I10

## 2015-04-24 LAB — URINALYSIS, ROUTINE W REFLEX MICROSCOPIC
Bilirubin Urine: NEGATIVE
GLUCOSE, UA: NEGATIVE mg/dL
HGB URINE DIPSTICK: NEGATIVE
Ketones, ur: NEGATIVE mg/dL
Nitrite: NEGATIVE
Protein, ur: NEGATIVE mg/dL
SPECIFIC GRAVITY, URINE: 1.02 (ref 1.005–1.030)
UROBILINOGEN UA: 0.2 mg/dL (ref 0.0–1.0)
pH: 6 (ref 5.0–8.0)

## 2015-04-24 LAB — URINE MICROSCOPIC-ADD ON

## 2015-04-24 LAB — WET PREP, GENITAL
Trich, Wet Prep: NONE SEEN
Yeast Wet Prep HPF POC: NONE SEEN

## 2015-04-24 LAB — POCT PREGNANCY, URINE: Preg Test, Ur: POSITIVE — AB

## 2015-04-24 MED ORDER — FLUCONAZOLE 150 MG PO TABS
150.0000 mg | ORAL_TABLET | Freq: Once | ORAL | Status: AC
  Administered 2015-04-24: 150 mg via ORAL
  Filled 2015-04-24: qty 1

## 2015-04-24 NOTE — Discharge Instructions (Signed)

## 2015-04-24 NOTE — MAU Provider Note (Signed)
History     CSN: [id number]  Arrival date and time: 04/24/15 0246   First Provider Initiated Contact with Patient 04/24/15 [phone]      Chief Complaint  Patient presents with  . Vaginal Itching   HPI  Michelle Duarte is a 34 y.o. [phone] at [redacted]w[redacted]d who presents to MAU today with complaint of vaginal itching. The patient states x 3 days of vaginal itching and irritation. She denies vaginal discharge, bleeding, abdominal pain, fever or UTI symptoms. She has tried 2 rounds of Monistat without relief.   OB History    Gravida Para Term Preterm AB TAB SAB Ectopic Multiple Living   4 0 0  2 1 1   1       Past Medical History  Diagnosis Date  . Abnormal Pap smear 2011  . Hypertension     Past Surgical History  Procedure Laterality Date  . Breast surgery      reduction    Family History  Problem Relation Age of Onset  . Hypertension Mother   . Drug abuse Mother     recovering addict  . Hypertension Maternal Aunt   . Diabetes Paternal Aunt   . Hypertension Maternal Grandmother   . Diabetes Paternal Grandmother     History  Substance Use Topics  . Smoking status: Former Research scientist (life sciences)  . Smokeless tobacco: Not on file  . Alcohol Use: No    Allergies: No Known Allergies  Prescriptions prior to admission  Medication Sig Dispense Refill Last Dose  . amLODipine (NORVASC) 10 MG tablet Take 10 mg by mouth daily.   Past Week at Unknown time  . Prenatal Vit-Fe Fumarate-FA (PRENATAL MULTIVITAMIN) TABS tablet Take 1 tablet by mouth daily at 12 noon.   04/23/2015 at Unknown time  . norethindrone-ethinyl estradiol (MICROGESTIN,JUNEL,LOESTRIN) 1-20 MG-MCG tablet Take 1 tablet by mouth daily.       Review of Systems  Constitutional: Negative for fever and malaise/fatigue.  Gastrointestinal: Negative for nausea, vomiting, abdominal pain, diarrhea and constipation.  Genitourinary: Negative for dysuria, urgency and frequency.       + vaginal itching, irritation Neg - vaginal discharge,  bleeding   Physical Exam   Blood pressure 124/82, pulse 63, temperature 98.7 F (37.1 C), temperature source Oral, resp. rate 20, height 5\' 3"  (1.6 m), weight 247 lb 4 oz (112.152 kg), last menstrual period 03/11/2015.  Physical Exam  Nursing note and vitals reviewed. Constitutional: She is oriented to person, place, and time. She appears well-developed and well-nourished. No distress.  HENT:  Head: Normocephalic and atraumatic.  Cardiovascular: Normal rate.   Respiratory: Effort normal.  GI: Soft. She exhibits no distension and no mass. There is no tenderness. There is no rebound and no guarding.  Genitourinary: Uterus is enlarged (slightly). Uterus is not tender. Cervix exhibits no motion tenderness, no discharge and no friability. Right adnexum displays no mass and no tenderness. Left adnexum displays no mass and no tenderness. No bleeding in the vagina. Vaginal discharge (small amount of thick, clumpy white discharge coating the vaginal walls) found.  Neurological: She is alert and oriented to person, place, and time.  Skin: Skin is warm and dry. No erythema.  Psychiatric: She has a normal mood and affect.   Results for orders placed or performed during the hospital encounter of 04/24/15 (from the past 24 hour(s))  Pregnancy, urine POC     Status: Abnormal   Collection Time: 04/24/15  4:12 AM  Result Value Ref Range   Preg  Test, Ur POSITIVE (A) NEGATIVE  Wet prep, genital     Status: Abnormal   Collection Time: 04/24/15  4:20 AM  Result Value Ref Range   Yeast Wet Prep HPF POC NONE SEEN NONE SEEN   Trich, Wet Prep NONE SEEN NONE SEEN   Clue Cells Wet Prep HPF POC FEW (A) NONE SEEN   WBC, Wet Prep HPF POC FEW (A) NONE SEEN    MAU Course  Procedures None  MDM +UPT UA, wet prep and GC/Chlamydia today Discussed with Dr. Monica Becton. Recommends treatment for yeast vulvovaginitis with Diflucan today.   Assessment and Plan  A: Yeast vulvovaginitis  P: Discharge home Treated  with Diflucan in MAU First trimester precautions discussed Patient advised to follow-up with Dr. Gaetano Net as scheduled to start prenatal care Patient may return to MAU as needed or if her condition were to change or worsen   Luvenia Redden, PA-C  04/24/2015, 4:36 AM

## 2015-04-24 NOTE — MAU Note (Signed)
PT SAYS SHE  HAS HAD ITCHING  AND BURNING   X3  DAYS .   [place]  - WITH  DR  [facility].    LAST SEX-   6-12     SHE PUT MONISTAT 7  NO RELIEF.     THEN BOUGHT  MONISTAT  1 - NO RELIEF

## 2015-04-25 LAB — GC/CHLAMYDIA PROBE AMP (~~LOC~~) NOT AT ARMC
CHLAMYDIA, DNA PROBE: NEGATIVE
Neisseria Gonorrhea: NEGATIVE

## 2015-05-09 LAB — OB RESULTS CONSOLE HEPATITIS B SURFACE ANTIGEN: HEP B S AG: NEGATIVE

## 2015-05-09 LAB — OB RESULTS CONSOLE RPR: RPR: NONREACTIVE

## 2015-05-09 LAB — OB RESULTS CONSOLE HIV ANTIBODY (ROUTINE TESTING): HIV: NONREACTIVE

## 2015-05-09 LAB — OB RESULTS CONSOLE ABO/RH: RH TYPE: POSITIVE

## 2015-05-09 LAB — OB RESULTS CONSOLE ANTIBODY SCREEN: Antibody Screen: NEGATIVE

## 2015-05-09 LAB — OB RESULTS CONSOLE RUBELLA ANTIBODY, IGM: Rubella: IMMUNE

## 2015-05-21 LAB — OB RESULTS CONSOLE GC/CHLAMYDIA
CHLAMYDIA, DNA PROBE: NEGATIVE
Gonorrhea: NEGATIVE

## 2015-06-07 ENCOUNTER — Ambulatory Visit (INDEPENDENT_AMBULATORY_CARE_PROVIDER_SITE_OTHER): Payer: 59 | Admitting: Family Medicine

## 2015-06-07 ENCOUNTER — Encounter: Payer: Self-pay | Admitting: Family Medicine

## 2015-06-07 VITALS — BP 130/89 | HR 71 | Ht 63.0 in | Wt 240.0 lb

## 2015-06-07 DIAGNOSIS — G5702 Lesion of sciatic nerve, left lower limb: Secondary | ICD-10-CM | POA: Diagnosis not present

## 2015-06-07 DIAGNOSIS — M25552 Pain in left hip: Secondary | ICD-10-CM | POA: Diagnosis not present

## 2015-06-07 NOTE — Patient Instructions (Signed)
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 Take tylenol 1-2 tabs three times a day as needed for pain. Do home exercises every day - hold for 20-30 seconds, repeat 3 times. Start physical therapy as well. Consider massage, chiropractic care (I'm not sure if they treat though if you're pregnant) Other measures we consider (prednisone, injection, other anti-inflammatories) are not indicated when pregnant. Follow up with me in 6 weeks.

## 2015-06-10 NOTE — Progress Notes (Signed)
PCP: Dr. Ashby Dawes  Subjective:   HPI: Patient is a 34 y.o. female here for left hip pain.  4/13: Patient reports she's had worsening left hip pain for about 1 month that has worsened over that time. Has been working out more recently - believes it worsened the day she was on the treadmill going faster than usual. Feels tight. Has tried stretching, tylenol, heating pad. No known injury. No history of stress fracture.  7/29: Patient reports she's been having left sided hip, buttock pain that has persisted since last visit though worsened now that she is pregnant. Feels like left leg is going to give out at times, feels weak. Only goes to posterior mid-thigh. No numbness/tingling. No bowel/bladder dysfunction. Has been resting.   Past Medical History  Diagnosis Date  . Abnormal Pap smear 2011  . Hypertension     Current Outpatient Prescriptions on File Prior to Visit  Medication Sig Dispense Refill  . amLODipine (NORVASC) 10 MG tablet Take 10 mg by mouth daily.    . Prenatal Vit-Fe Fumarate-FA (PRENATAL MULTIVITAMIN) TABS tablet Take 1 tablet by mouth daily at 12 noon.     No current facility-administered medications on file prior to visit.    Past Surgical History  Procedure Laterality Date  . Breast surgery      reduction    No Known Allergies  History   Social History  . Marital Status: Single    Spouse Name: N/A  . Number of Children: N/A  . Years of Education: N/A   Occupational History  . Not on file.   Social History Main Topics  . Smoking status: Former Research scientist (life sciences)  . Smokeless tobacco: Not on file  . Alcohol Use: No  . Drug Use: No  . Sexual Activity: Yes   Other Topics Concern  . Not on file   Social History Narrative    Family History  Problem Relation Age of Onset  . Hypertension Mother   . Drug abuse Mother     recovering addict  . Hypertension Maternal Aunt   . Diabetes Paternal Aunt   . Hypertension Maternal Grandmother   .  Diabetes Paternal Grandmother     BP 130/89 mmHg  Pulse 71  Ht 5\' 3"  (1.6 m)  Wt 240 lb (108.863 kg)  BMI 42.52 kg/m2  LMP 03/11/2015  Review of Systems: See HPI above.    Objective:  Physical Exam:  Gen: NAD  Left hip/back: No gross deformity, scoliosis. No TTP greater trochanter.  Tenderness at piriformis, SI joint.  No midline or bony TTP of back.  No other tenderness. FROM back and hip without pain. Strength LEs 5/5 all muscle groups.   Negative SLRs. Sensation intact to light touch bilaterally. Negative logroll bilateral hips Positive fabers, piriformis on right, negative left.  Assessment & Plan:  1. Left hip pain - consistent with piriformis syndrome, SI joint dysfunction.  Options are limited given she is pregnant - tylenol only as needed for pain.  Start physical therapy, home exercises.  Massage is another consideration.  F/u in 6 weeks.

## 2015-06-10 NOTE — Assessment & Plan Note (Signed)
consistent with piriformis syndrome, SI joint dysfunction.  Options are limited given she is pregnant - tylenol only as needed for pain.  Start physical therapy, home exercises.  Massage is another consideration.  F/u in 6 weeks.

## 2015-07-02 ENCOUNTER — Ambulatory Visit: Payer: 59 | Attending: Family Medicine

## 2015-07-08 ENCOUNTER — Other Ambulatory Visit (HOSPITAL_COMMUNITY): Payer: Self-pay | Admitting: Obstetrics and Gynecology

## 2015-07-08 ENCOUNTER — Ambulatory Visit: Payer: 59

## 2015-07-08 DIAGNOSIS — Z3A18 18 weeks gestation of pregnancy: Secondary | ICD-10-CM

## 2015-07-08 DIAGNOSIS — Z3689 Encounter for other specified antenatal screening: Secondary | ICD-10-CM

## 2015-07-12 ENCOUNTER — Encounter (HOSPITAL_COMMUNITY): Payer: Self-pay | Admitting: Obstetrics and Gynecology

## 2015-07-18 ENCOUNTER — Inpatient Hospital Stay (HOSPITAL_COMMUNITY)
Admission: AD | Admit: 2015-07-18 | Discharge: 2015-07-18 | Disposition: A | Discharge status: Home / Self Care | Payer: 59 | Source: Ambulatory Visit | Attending: Obstetrics and Gynecology | Admitting: Obstetrics and Gynecology

## 2015-07-18 ENCOUNTER — Encounter (HOSPITAL_COMMUNITY): Payer: Self-pay

## 2015-07-18 DIAGNOSIS — Z3A18 18 weeks gestation of pregnancy: Secondary | ICD-10-CM | POA: Insufficient documentation

## 2015-07-18 DIAGNOSIS — O10912 Unspecified pre-existing hypertension complicating pregnancy, second trimester: Secondary | ICD-10-CM | POA: Insufficient documentation

## 2015-07-18 DIAGNOSIS — O26892 Other specified pregnancy related conditions, second trimester: Secondary | ICD-10-CM

## 2015-07-18 DIAGNOSIS — Z87891 Personal history of nicotine dependence: Secondary | ICD-10-CM | POA: Insufficient documentation

## 2015-07-18 DIAGNOSIS — R51 Headache: Secondary | ICD-10-CM | POA: Diagnosis present

## 2015-07-18 LAB — COMPREHENSIVE METABOLIC PANEL
ALT: 10 U/L — ABNORMAL LOW (ref 14–54)
ANION GAP: 8 (ref 5–15)
AST: 13 U/L — ABNORMAL LOW (ref 15–41)
Albumin: 3 g/dL — ABNORMAL LOW (ref 3.5–5.0)
Alkaline Phosphatase: 57 U/L (ref 38–126)
BILIRUBIN TOTAL: 0.2 mg/dL — AB (ref 0.3–1.2)
BUN: 9 mg/dL (ref 6–20)
CO2: 26 mmol/L (ref 22–32)
CREATININE: 0.68 mg/dL (ref 0.44–1.00)
Calcium: 9.1 mg/dL (ref 8.9–10.3)
Chloride: 103 mmol/L (ref 101–111)
GFR calc Af Amer: >60 mL/min (ref >60)
GFR calc non Af Amer: >60 mL/min (ref >60)
Glucose, Bld: 84 mg/dL (ref 65–99)
Potassium: 3.2 mmol/L — ABNORMAL LOW (ref 3.5–5.1)
Sodium: 137 mmol/L (ref 135–145)
TOTAL PROTEIN: 6.4 g/dL — AB (ref 6.5–8.1)

## 2015-07-18 LAB — URINE MICROSCOPIC-ADD ON

## 2015-07-18 LAB — CBC
HEMATOCRIT: 34.8 % — AB (ref 36.0–46.0)
HEMOGLOBIN: 11.5 g/dL — AB (ref 12.0–15.0)
MCH: 25.8 pg — ABNORMAL LOW (ref 26.0–34.0)
MCHC: 33 g/dL (ref 30.0–36.0)
MCV: 78 fL (ref 78.0–100.0)
Platelets: 236 10*3/uL (ref 150–400)
RBC: 4.46 MIL/uL (ref 3.87–5.11)
RDW: 16.4 % — ABNORMAL HIGH (ref 11.5–15.5)
WBC: 10.2 10*3/uL (ref 4.0–10.5)

## 2015-07-18 LAB — URINALYSIS, ROUTINE W REFLEX MICROSCOPIC
Bilirubin Urine: NEGATIVE
GLUCOSE, UA: NEGATIVE mg/dL
Ketones, ur: NEGATIVE mg/dL
LEUKOCYTES UA: NEGATIVE
Nitrite: NEGATIVE
PH: 6 (ref 5.0–8.0)
Protein, ur: NEGATIVE mg/dL
Specific Gravity, Urine: >1.03 — ABNORMAL HIGH (ref 1.005–1.030)
Urobilinogen, UA: 0.2 mg/dL (ref 0.0–1.0)

## 2015-07-18 MED ORDER — DIPHENHYDRAMINE HCL 50 MG/ML IJ SOLN
25.0000 mg | Freq: Once | INTRAMUSCULAR | Status: AC
  Administered 2015-07-18: 25 mg via INTRAVENOUS
  Filled 2015-07-18: qty 1

## 2015-07-18 MED ORDER — SODIUM CHLORIDE 0.9 % IV SOLN
INTRAVENOUS | Status: DC
  Administered 2015-07-18: 20:00:00 via INTRAVENOUS

## 2015-07-18 MED ORDER — PROMETHAZINE HCL 25 MG/ML IJ SOLN: 25.0000 mg | Freq: Once | INTRAMUSCULAR | Status: DC

## 2015-07-18 MED ORDER — METOCLOPRAMIDE HCL 5 MG/ML IJ SOLN
10.0000 mg | Freq: Once | INTRAMUSCULAR | Status: AC
  Administered 2015-07-18: 10 mg via INTRAVENOUS
  Filled 2015-07-18: qty 2

## 2015-07-18 MED ORDER — DEXAMETHASONE SODIUM PHOSPHATE 10 MG/ML IJ SOLN
10.0000 mg | Freq: Once | INTRAMUSCULAR | Status: AC
  Administered 2015-07-18: 10 mg via INTRAVENOUS
  Filled 2015-07-18: qty 1

## 2015-07-18 NOTE — Discharge Instructions (Signed)

## 2015-07-18 NOTE — MAU Note (Signed)
Urine in lab 

## 2015-07-18 NOTE — MAU Note (Signed)
Pt reports having BP taken at [facility] and was 187/101. Had a HA all day long. Has been on BP meds before but is not on anything currently.

## 2015-07-18 NOTE — MAU Provider Note (Signed)
History     CSN: [id number]  Arrival date and time: 07/18/15 4656   First Provider Initiated Contact with Patient 07/18/15 1937         Chief Complaint  Patient presents with  . Hypertension  . Headache   HPI  Michelle Duarte is a 34 y.o. [id number] at [redacted]w[redacted]d who presents with hypertension and headache.  Pt has history of chronic HTN; was on norvasc prior to pregnancy.  Had [facility] pharmacist check BP d/t her headache; 180/100.  Denies chest pain or SOB.   Woke up this morning with headache. Frontal throbbing headache with photophobia. Feels like migraines she's had in the past. Rates 8/10 on pain scale. Has been taking tylenol throughout the day with no relief. Some nausea & one episode of vomiting this afternoon.  Denies fever or dizziness.   Denies abdominal pain or vaginal bleeding.     OB History    Gravida Para Term Preterm AB TAB SAB Ectopic Multiple Living   4 0 0  2 1 1   1       Past Medical History  Diagnosis Date  . Abnormal Pap smear 2011  . Hypertension     Past Surgical History  Procedure Laterality Date  . Breast surgery      reduction  . Wisdom tooth extraction      Family History  Problem Relation Age of Onset  . Hypertension Mother   . Drug abuse Mother     recovering addict  . Hypertension Maternal Aunt   . Diabetes Paternal Aunt   . Hypertension Maternal Grandmother   . Diabetes Paternal Grandmother     Social History  Substance Use Topics  . Smoking status: Former Research scientist (life sciences)  . Smokeless tobacco: None  . Alcohol Use: No    Allergies: No Known Allergies  Prescriptions prior to admission  Medication Sig Dispense Refill Last Dose  . amLODipine (NORVASC) 10 MG tablet Take 10 mg by mouth daily.   Past Week at Unknown time  . Prenatal Vit-Fe Fumarate-FA (PRENATAL MULTIVITAMIN) TABS tablet Take 1 tablet by mouth daily at 12 noon.   04/23/2015 at Unknown time    Review of Systems  Constitutional: Negative.  Negative for fever.  HENT:  Negative for tinnitus.   Eyes: Negative for blurred vision.  Respiratory: Negative for shortness of breath.   Cardiovascular: Negative.  Negative for chest pain and palpitations.  Gastrointestinal: Positive for nausea and vomiting. Negative for abdominal pain, diarrhea and constipation.  Genitourinary: Negative for dysuria.       No vaginal bleeding  Neurological: Positive for headaches. Negative for dizziness, sensory change, speech change and loss of consciousness.   Physical Exam   Blood pressure 148/94, pulse 81, temperature 98.3 F (36.8 C), temperature source Oral, resp. rate 20, height 5\' 3"  (1.6 m), weight 116.121 kg (256 lb), last menstrual period 03/11/2015.  Patient Vitals for the past 24 hrs:  BP Temp Temp src Pulse Resp Height Weight  07/18/15 2147 135/59 mmHg 98.6 F (37 C) Oral 64 18 - -  07/18/15 2106 140/79 mmHg 98.6 F (37 C) Oral 69 18 - -  07/18/15 2001 147/79 mmHg - - 68 - - -  07/18/15 1946 154/82 mmHg - - 76 - - -  07/18/15 1930 151/85 mmHg - - 82 - - -  07/18/15 1923 148/94 mmHg 98.3 F (36.8 C) Oral 81 20 5\' 3"  (1.6 m) 116.121 kg (256 lb)    Physical Exam  Nursing note and  vitals reviewed. Constitutional: She is oriented to person, place, and time. She appears well-developed and well-nourished. No distress.  HENT:  Head: Normocephalic and atraumatic.  Eyes: Conjunctivae are normal. Right eye exhibits no discharge. Left eye exhibits no discharge. No scleral icterus.  Neck: Normal range of motion.  Cardiovascular: Normal rate, regular rhythm and normal heart sounds.   No murmur heard. Respiratory: Effort normal and breath sounds normal. No respiratory distress. She has no wheezes.  GI: Soft.  Neurological: She is alert and oriented to person, place, and time. She has normal reflexes.  Skin: Skin is warm and dry. She is not diaphoretic.  Psychiatric: She has a normal mood and affect. Her behavior is normal. Judgment and thought content normal.   FHT:  154 per doppler MAU Course  Procedures Results for orders placed or performed during the hospital encounter of 07/18/15 (from the past 24 hour(s))  Urinalysis, Routine w reflex microscopic (not at [facility])     Status: Abnormal   Collection Time: 07/18/15  6:34 PM  Result Value Ref Range   Color, Urine YELLOW YELLOW   APPearance CLEAR CLEAR   Specific Gravity, Urine >1.030 (H) 1.005 - 1.030   pH 6.0 5.0 - 8.0   Glucose, UA NEGATIVE NEGATIVE mg/dL   Hgb urine dipstick TRACE (A) NEGATIVE   Bilirubin Urine NEGATIVE NEGATIVE   Ketones, ur NEGATIVE NEGATIVE mg/dL   Protein, ur NEGATIVE NEGATIVE mg/dL   Urobilinogen, UA 0.2 0.0 - 1.0 mg/dL   Nitrite NEGATIVE NEGATIVE   Leukocytes, UA NEGATIVE NEGATIVE  Urine microscopic-add on     Status: Abnormal   Collection Time: 07/18/15  6:34 PM  Result Value Ref Range   Squamous Epithelial / LPF RARE RARE   WBC, UA 0-2 <3 WBC/hpf   RBC / HPF 0-2 <3 RBC/hpf   Bacteria, UA RARE RARE   Crystals CA OXALATE CRYSTALS (A) NEGATIVE  CBC     Status: Abnormal   Collection Time: 07/18/15  7:57 PM  Result Value Ref Range   WBC 10.2 4.0 - 10.5 K/uL   RBC 4.46 3.87 - 5.11 MIL/uL   Hemoglobin 11.5 (L) 12.0 - 15.0 g/dL   HCT 34.8 (L) 36.0 - 46.0 %   MCV 78.0 78.0 - 100.0 fL   MCH 25.8 (L) 26.0 - 34.0 pg   MCHC 33.0 30.0 - 36.0 g/dL   RDW 16.4 (H) 11.5 - 15.5 %   Platelets 236 150 - 400 K/uL  Comprehensive metabolic panel     Status: Abnormal   Collection Time: 07/18/15  7:57 PM  Result Value Ref Range   Sodium 137 135 - 145 mmol/L   Potassium 3.2 (L) 3.5 - 5.1 mmol/L   Chloride 103 101 - 111 mmol/L   CO2 26 22 - 32 mmol/L   Glucose, Bld 84 65 - 99 mg/dL   BUN 9 6 - 20 mg/dL   Creatinine, Ser 0.68 0.44 - 1.00 mg/dL   Calcium 9.1 8.9 - 10.3 mg/dL   Total Protein 6.4 (L) 6.5 - 8.1 g/dL   Albumin 3.0 (L) 3.5 - 5.0 g/dL   AST 13 (L) 15 - 41 U/L   ALT 10 (L) 14 - 54 U/L   Alkaline Phosphatase 57 38 - 126 U/L   Total Bilirubin 0.2 (L) 0.3 - 1.2 mg/dL    GFR calc non Af Amer >60 >60 mL/min   GFR calc Af Amer >60 >60 mL/min   Anion gap 8 5 - 15    MDM  IV started, headache cocktail given Headache resolved with meds FHT 154 by doppler C/w Dr. Julien Girt - discharge home, Have patient call office in the morning for BP check  Assessment and Plan  A: 1. Chronic hypertension in pregnancy, second trimester   2. Headache in pregnancy, second trimester    P: Discharge home Call office in the AM to have BP checked & discuss medication options Discussed reasons to return  Jorje Guild, NP  07/18/2015, 7:36 PM

## 2015-07-19 ENCOUNTER — Ambulatory Visit: Payer: 59 | Admitting: Family Medicine

## 2015-07-24 ENCOUNTER — Ambulatory Visit (HOSPITAL_COMMUNITY): Admission: RE | Admit: 2015-07-24 | Payer: 59 | Source: Ambulatory Visit

## 2015-07-24 ENCOUNTER — Encounter (HOSPITAL_COMMUNITY): Payer: Self-pay

## 2015-07-24 ENCOUNTER — Ambulatory Visit (HOSPITAL_COMMUNITY)
Admission: RE | Admit: 2015-07-24 | Discharge: 2015-07-24 | Disposition: A | Discharge status: Home / Self Care | Payer: 59 | Source: Ambulatory Visit | Attending: Obstetrics and Gynecology | Admitting: Obstetrics and Gynecology

## 2015-07-24 VITALS — BP 142/84 | HR 90 | Wt 255.2 lb

## 2015-07-24 DIAGNOSIS — Z3A18 18 weeks gestation of pregnancy: Secondary | ICD-10-CM | POA: Insufficient documentation

## 2015-07-24 DIAGNOSIS — Z36 Encounter for antenatal screening of mother: Secondary | ICD-10-CM | POA: Diagnosis not present

## 2015-07-24 DIAGNOSIS — IMO0002 Reserved for concepts with insufficient information to code with codable children: Secondary | ICD-10-CM

## 2015-07-24 DIAGNOSIS — Z3689 Encounter for other specified antenatal screening: Secondary | ICD-10-CM

## 2015-07-24 DIAGNOSIS — Z0489 Encounter for examination and observation for other specified reasons: Secondary | ICD-10-CM

## 2015-07-25 ENCOUNTER — Other Ambulatory Visit (HOSPITAL_COMMUNITY): Payer: Self-pay | Admitting: Obstetrics and Gynecology

## 2015-07-25 ENCOUNTER — Encounter (HOSPITAL_COMMUNITY): Payer: Self-pay | Admitting: Obstetrics and Gynecology

## 2015-07-26 ENCOUNTER — Ambulatory Visit (HOSPITAL_COMMUNITY): Payer: 59

## 2015-07-26 ENCOUNTER — Encounter (HOSPITAL_COMMUNITY): Payer: 59

## 2015-08-21 ENCOUNTER — Ambulatory Visit (HOSPITAL_COMMUNITY)
Admission: RE | Admit: 2015-08-21 | Discharge: 2015-08-21 | Disposition: A | Discharge status: Home / Self Care | Payer: 59 | Source: Ambulatory Visit | Attending: Obstetrics and Gynecology | Admitting: Obstetrics and Gynecology

## 2015-08-21 DIAGNOSIS — Z36 Encounter for antenatal screening of mother: Secondary | ICD-10-CM | POA: Insufficient documentation

## 2015-08-21 DIAGNOSIS — Z0489 Encounter for examination and observation for other specified reasons: Secondary | ICD-10-CM

## 2015-08-21 DIAGNOSIS — IMO0002 Reserved for concepts with insufficient information to code with codable children: Secondary | ICD-10-CM

## 2015-08-27 ENCOUNTER — Telehealth: Payer: Self-pay | Admitting: Family Medicine

## 2015-08-27 NOTE — Telephone Encounter (Signed)
We should see her for an office visit - last time was about 3 months ago.  See if she can come in some time this week.  Thanks!

## 2015-08-28 NOTE — Telephone Encounter (Signed)
Appointment scheduled for Friday. 

## 2015-08-30 ENCOUNTER — Ambulatory Visit (INDEPENDENT_AMBULATORY_CARE_PROVIDER_SITE_OTHER): Payer: 59 | Admitting: Family Medicine

## 2015-08-30 ENCOUNTER — Encounter: Payer: Self-pay | Admitting: Family Medicine

## 2015-08-30 VITALS — BP 125/83 | HR 88 | Ht 63.0 in | Wt 257.0 lb

## 2015-08-30 DIAGNOSIS — M25552 Pain in left hip: Secondary | ICD-10-CM

## 2015-08-30 NOTE — Patient Instructions (Signed)
Fax or drop off the FMLA paperwork for me to fill out. Continue home exercises and stretches as you have been. Hold the stretches for 20-30 seconds at a time, repeat 3 times once or twice a day. Tylenol 500mg  1-2 tabs three times a day as needed for pain. Heat (or ice) as needed 15 minutes at a time 3-4 times a day. Follow up with me in 3 months.

## 2015-09-03 NOTE — Progress Notes (Signed)
PCP: Dr. Ashby Dawes  Subjective:   HPI: Patient is a 34 y.o. female here for left hip pain.  4/13: Patient reports she's had worsening left hip pain for about 1 month that has worsened over that time. Has been working out more recently - believes it worsened the day she was on the treadmill going faster than usual. Feels tight. Has tried stretching, tylenol, heating pad. No known injury. No history of stress fracture.  7/29: Patient reports she's been having left sided hip, buttock pain that has persisted since last visit though worsened now that she is pregnant. Feels like left leg is going to give out at times, feels weak. Only goes to posterior mid-thigh. No numbness/tingling. No bowel/bladder dysfunction. Has been resting.  10/21:  Patient reports pain is at a 7/10 level left hip, buttock. Has tried heat/cold. Now about [redacted] weeks pregnant. Taking tylenol. Pain is sharp, constant. Difficulty getting to work on time, a lot of walking. No bowel/bladder dysfunction. No numbness or tingling. Doing home exercises. No skin changes, fever.  Past Medical History  Diagnosis Date  . Abnormal Pap smear 2011  . Hypertension     Current Outpatient Prescriptions on File Prior to Visit  Medication Sig Dispense Refill  . acetaminophen (TYLENOL) 500 MG tablet Take 1,000 mg by mouth every 6 (six) hours as needed for headache.    . Prenatal Vit-Fe Fumarate-FA (PRENATAL MULTIVITAMIN) TABS tablet Take 2 tablets by mouth daily at 12 noon.      No current facility-administered medications on file prior to visit.    Past Surgical History  Procedure Laterality Date  . Breast surgery      reduction  . Wisdom tooth extraction      No Known Allergies  Social History   Social History  . Marital Status: Single    Spouse Name: N/A  . Number of Children: N/A  . Years of Education: N/A   Occupational History  . Not on file.   Social History Main Topics  . Smoking status:  Former Research scientist (life sciences)  . Smokeless tobacco: Not on file  . Alcohol Use: No  . Drug Use: No  . Sexual Activity: Yes   Other Topics Concern  . Not on file   Social History Narrative    Family History  Problem Relation Age of Onset  . Hypertension Mother   . Drug abuse Mother     recovering addict  . Hypertension Maternal Aunt   . Diabetes Paternal Aunt   . Hypertension Maternal Grandmother   . Diabetes Paternal Grandmother     BP 125/83 mmHg  Pulse 88  Ht 5\' 3"  (1.6 m)  Wt 257 lb (116.574 kg)  BMI 45.54 kg/m2  LMP 03/11/2015  Review of Systems: See HPI above.    Objective:  Physical Exam:  Gen: NAD  Back: No gross deformity, scoliosis. No TTP greater trochanter.  Tenderness at piriformis, SI joint.  No midline or bony TTP of back.  No other tenderness. FROM back and hip without pain. Strength LEs 5/5 all muscle groups.   Negative SLRs. Sensation intact to light touch bilaterally.  Left hip: Negative logroll bilateral hips Positive fabers, piriformis on right, negative left.  Assessment & Plan:  1. Left hip pain - consistent with piriformis syndrome, SI joint dysfunction.  Continue with tylenol, home exercise program.  Heat as needed.  F/u in 3 months.  Could consider repeating physical therapy in the future, massage.  Discussed dropping off FMLA paperwork - may miss  3 days every 6 weeks due to these issues, at times may be 15 minutes late to work.

## 2015-09-03 NOTE — Assessment & Plan Note (Signed)
consistent with piriformis syndrome, SI joint dysfunction.  Continue with tylenol, home exercise program.  Heat as needed.  F/u in 3 months.  Could consider repeating physical therapy in the future, massage.  Discussed dropping off FMLA paperwork - may miss 3 days every 6 weeks due to these issues, at times may be 15 minutes late to work.

## 2015-09-18 ENCOUNTER — Telehealth: Payer: Self-pay | Admitting: Family Medicine

## 2015-09-18 NOTE — Telephone Encounter (Signed)
The paperwork was sent in earlier this week so they should have it.  Unfortunately because she is pregnant she can only take tylenol and/or topical medicines (like capsaicin, aspercreme, or biofreeze).

## 2015-09-18 NOTE — Telephone Encounter (Signed)
Spoke to patient and gave her information provided by physician about paperwork and can take tylenol and/or topical medicines.

## 2015-10-22 ENCOUNTER — Inpatient Hospital Stay (HOSPITAL_COMMUNITY)
Admission: EM | Admit: 2015-10-22 | Discharge: 2015-10-22 | Disposition: A | Discharge status: Other Acute Inpatient Hospital | Payer: 59 | Attending: Obstetrics and Gynecology | Admitting: Obstetrics and Gynecology

## 2015-10-22 ENCOUNTER — Encounter (HOSPITAL_COMMUNITY): Payer: Self-pay

## 2015-10-22 ENCOUNTER — Inpatient Hospital Stay (HOSPITAL_COMMUNITY): Payer: 59

## 2015-10-22 DIAGNOSIS — S29001A Unspecified injury of muscle and tendon of front wall of thorax, initial encounter: Secondary | ICD-10-CM | POA: Diagnosis not present

## 2015-10-22 DIAGNOSIS — S3992XA Unspecified injury of lower back, initial encounter: Secondary | ICD-10-CM | POA: Insufficient documentation

## 2015-10-22 DIAGNOSIS — Y998 Other external cause status: Secondary | ICD-10-CM | POA: Diagnosis not present

## 2015-10-22 DIAGNOSIS — Z79899 Other long term (current) drug therapy: Secondary | ICD-10-CM | POA: Diagnosis not present

## 2015-10-22 DIAGNOSIS — Z3A31 31 weeks gestation of pregnancy: Secondary | ICD-10-CM | POA: Insufficient documentation

## 2015-10-22 DIAGNOSIS — Z87891 Personal history of nicotine dependence: Secondary | ICD-10-CM | POA: Insufficient documentation

## 2015-10-22 DIAGNOSIS — O133 Gestational [pregnancy-induced] hypertension without significant proteinuria, third trimester: Secondary | ICD-10-CM | POA: Diagnosis not present

## 2015-10-22 DIAGNOSIS — O9A213 Injury, poisoning and certain other consequences of external causes complicating pregnancy, third trimester: Secondary | ICD-10-CM | POA: Diagnosis not present

## 2015-10-22 DIAGNOSIS — Y9389 Activity, other specified: Secondary | ICD-10-CM | POA: Insufficient documentation

## 2015-10-22 DIAGNOSIS — O26893 Other specified pregnancy related conditions, third trimester: Secondary | ICD-10-CM

## 2015-10-22 DIAGNOSIS — O99213 Obesity complicating pregnancy, third trimester: Secondary | ICD-10-CM

## 2015-10-22 DIAGNOSIS — O10913 Unspecified pre-existing hypertension complicating pregnancy, third trimester: Secondary | ICD-10-CM

## 2015-10-22 DIAGNOSIS — Z3493 Encounter for supervision of normal pregnancy, unspecified, third trimester: Secondary | ICD-10-CM

## 2015-10-22 DIAGNOSIS — Y92410 Unspecified street and highway as the place of occurrence of the external cause: Secondary | ICD-10-CM | POA: Diagnosis not present

## 2015-10-22 LAB — CBC WITH DIFFERENTIAL/PLATELET
Basophils Absolute: 0 10*3/uL (ref 0.0–0.1)
Basophils Relative: 0 %
EOS ABS: 0.1 10*3/uL (ref 0.0–0.7)
EOS PCT: 1 %
HCT: 32.3 % — ABNORMAL LOW (ref 36.0–46.0)
Hemoglobin: 10.6 g/dL — ABNORMAL LOW (ref 12.0–15.0)
LYMPHS ABS: 1.7 10*3/uL (ref 0.7–4.0)
LYMPHS PCT: 19 %
MCH: 26.2 pg (ref 26.0–34.0)
MCHC: 32.8 g/dL (ref 30.0–36.0)
MCV: 80 fL (ref 78.0–100.0)
MONOS PCT: 8 %
Monocytes Absolute: 0.7 10*3/uL (ref 0.1–1.0)
Neutro Abs: 6.2 10*3/uL (ref 1.7–7.7)
Neutrophils Relative %: 72 %
PLATELETS: 221 10*3/uL (ref 150–400)
RBC: 4.04 MIL/uL (ref 3.87–5.11)
RDW: 15.2 % (ref 11.5–15.5)
WBC: 8.7 10*3/uL (ref 4.0–10.5)

## 2015-10-22 LAB — KLEIHAUER-BETKE STAIN
# Vials RhIg: 1
Fetal Cells %: 0.1 %
Quantitation Fetal Hemoglobin: 5 mL

## 2015-10-22 MED ORDER — CYCLOBENZAPRINE HCL 5 MG PO TABS: 5.0000 mg | ORAL_TABLET | Freq: Three times a day (TID) | ORAL | Status: DC | PRN

## 2015-10-22 MED ORDER — ACETAMINOPHEN 325 MG PO TABS
650.0000 mg | ORAL_TABLET | Freq: Four times a day (QID) | ORAL | Status: DC | PRN
  Administered 2015-10-22: 650 mg via ORAL
  Filled 2015-10-22: qty 2

## 2015-10-22 MED ORDER — ACETAMINOPHEN 325 MG PO TABS
650.0000 mg | ORAL_TABLET | Freq: Once | ORAL | Status: AC
  Administered 2015-10-22: 650 mg via ORAL
  Filled 2015-10-22: qty 2

## 2015-10-22 MED ORDER — SODIUM CHLORIDE 0.9 % IV BOLUS (SEPSIS)
1000.0000 mL | Freq: Once | INTRAVENOUS | Status: AC
  Administered 2015-10-22: 1000 mL via INTRAVENOUS

## 2015-10-22 NOTE — ED Notes (Signed)
[facility] at bedside to transport patient.

## 2015-10-22 NOTE — ED Notes (Signed)
This note also relates to the following rows which could not be included: Pulse Rate - Cannot attach notes to unvalidated device data SpO2 - Cannot attach notes to unvalidated device data   [facility]

## 2015-10-22 NOTE — Progress Notes (Signed)
[facility] and gave report to [facility].
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
## 2015-10-22 NOTE — Discharge Instructions (Signed)

## 2015-10-22 NOTE — Progress Notes (Signed)
RROB spoke with Dr Radene Knee and told that blood work was drawn and pending; unable to perform u/s with bpp at [facility]; [facility] to assess amniotic fluid index, visualize placenta, and bpp and for further efm.

## 2015-10-22 NOTE — MAU Note (Signed)
Pt presents via [facility] from [facility] ED post [facility] for monitoring. Denies any vaginal bleeding or LOF. Reports baby is active
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
## 2015-10-22 NOTE — MAU Note (Signed)
Pt BP verified with CNM-okay to discharge home. Pt has CHTN and on labetalol BID. Instructed pt to take P.M. Dose. Pt given instructions on when to return to hospital, including s/s of preeclampsia, labor, decreased fetal movement.

## 2015-11-01 ENCOUNTER — Encounter (HOSPITAL_COMMUNITY): Payer: Self-pay | Admitting: *Deleted

## 2015-11-01 ENCOUNTER — Inpatient Hospital Stay (HOSPITAL_COMMUNITY)
Admission: AD | Admit: 2015-11-01 | Discharge: 2015-11-02 | Disposition: A | Discharge status: Home / Self Care | Payer: 59 | Source: Ambulatory Visit | Attending: Obstetrics and Gynecology | Admitting: Obstetrics and Gynecology

## 2015-11-01 DIAGNOSIS — O10913 Unspecified pre-existing hypertension complicating pregnancy, third trimester: Secondary | ICD-10-CM | POA: Insufficient documentation

## 2015-11-01 DIAGNOSIS — O9989 Other specified diseases and conditions complicating pregnancy, childbirth and the puerperium: Secondary | ICD-10-CM | POA: Diagnosis not present

## 2015-11-01 DIAGNOSIS — O26893 Other specified pregnancy related conditions, third trimester: Secondary | ICD-10-CM | POA: Diagnosis not present

## 2015-11-01 DIAGNOSIS — J069 Acute upper respiratory infection, unspecified: Secondary | ICD-10-CM

## 2015-11-01 DIAGNOSIS — R0602 Shortness of breath: Secondary | ICD-10-CM | POA: Diagnosis present

## 2015-11-01 DIAGNOSIS — Z87891 Personal history of nicotine dependence: Secondary | ICD-10-CM | POA: Insufficient documentation

## 2015-11-01 DIAGNOSIS — Z3A32 32 weeks gestation of pregnancy: Secondary | ICD-10-CM | POA: Insufficient documentation

## 2015-11-01 DIAGNOSIS — O10919 Unspecified pre-existing hypertension complicating pregnancy, unspecified trimester: Secondary | ICD-10-CM

## 2015-11-01 DIAGNOSIS — R0600 Dyspnea, unspecified: Secondary | ICD-10-CM | POA: Diagnosis not present

## 2015-11-01 DIAGNOSIS — O99513 Diseases of the respiratory system complicating pregnancy, third trimester: Secondary | ICD-10-CM | POA: Insufficient documentation

## 2015-11-01 DIAGNOSIS — Z8249 Family history of ischemic heart disease and other diseases of the circulatory system: Secondary | ICD-10-CM | POA: Insufficient documentation

## 2015-11-01 MED ORDER — LACTATED RINGERS IV BOLUS (SEPSIS)
1000.0000 mL | Freq: Once | INTRAVENOUS | Status: AC
  Administered 2015-11-02: 1000 mL via INTRAVENOUS

## 2015-11-01 NOTE — MAU Note (Signed)
Has been in and out of [facility] ICU visiting an uncle this wk and was told a respiratory infection going around in that unit. Started feeling badly 3 days ago. Has nasal congestion, and sometimes coughs up yellow/green stuff. When I try to talk I get short of breath. Lost my voice yesterday. Denies vag bleeding, LOF, d/c

## 2015-11-01 NOTE — MAU Provider Note (Signed)
Chief Complaint:  Shortness of Breath; Fever; and Nasal Congestion   First Provider Initiated Contact with Patient 11/01/15 2351      HPI: Michelle Duarte is a 34 y.o. [phone] at 3w5dwho presents to maternity admissions reporting onset of respiratory symptoms with nasal congestion and cough 3 days ago with worsening shortness of breath and chills/sweats today.  Cough is productive with yellow sputum.  She reports that she has been in the ICU visiting a family member this week and may have gotten this respiratory infection there.  She denies visual changes or epigastric pain but does report h/a since onset of respiratory symptoms. She took Tylenol cold 2 hours before coming to MAU and reports her h/a is reduced but not gone and her chills are resolved.  She did not take her temperature at home.  She is on Labetalol 200 mg BID for CHTN in pregnancy. She reports good fetal movement, denies LOF, vaginal bleeding, vaginal itching/burning, urinary symptoms, h/a, dizziness, or n/v.  HPI  Past Medical History: Past Medical History  Diagnosis Date  . Abnormal Pap smear 2011  . Hypertension     Past obstetric history: OB History  Gravida Para Term Preterm AB SAB TAB Ectopic Multiple Living  4 0 0  2 1 1   1     # Outcome Date GA Lbr Len/2nd Weight Sex Delivery Anes PTL Lv  4 Current           3 Gravida      Vag-Spont     2 TAB   / 00:08        1 SAB               Past Surgical History: Past Surgical History  Procedure Laterality Date  . Breast surgery      reduction  . Wisdom tooth extraction    . Therapeutic abortion  2000    Family History: Family History  Problem Relation Age of Onset  . Hypertension Mother   . Drug abuse Mother     recovering addict  . Hypertension Maternal Aunt   . Diabetes Paternal Aunt   . Hypertension Maternal Grandmother   . Diabetes Paternal Grandmother     Social History: Social History  Substance Use Topics  . Smoking status: Former Research scientist (life sciences)  .  Smokeless tobacco: None  . Alcohol Use: No    Allergies:  Allergies  Allergen Reactions  . Benadryl [Diphenhydramine Hcl]     Via IV     Meds:  Prescriptions prior to admission  Medication Sig Dispense Refill Last Dose  . acetaminophen (TYLENOL) 500 MG tablet Take 1,000 mg by mouth every 6 (six) hours as needed for headache.   11/01/2015 at 1030  . cyclobenzaprine (FLEXERIL) 5 MG tablet Take 1 tablet (5 mg total) by mouth every 8 (eight) hours as needed for muscle spasms. 30 tablet 0 11/01/2015 at 0730  . OVER THE COUNTER MEDICATION TYLENOL COLD & COUGH   11/01/2015 at Unknown time  . Prenatal MV-Min-Fe Cbn-FA-DHA (PRENATAL PLUS DHA PO) Take 2 tablets by mouth daily.   10/31/2015 at Unknown time  . labetalol (NORMODYNE) 100 MG tablet Take 100 mg by mouth 2 (two) times daily.     at 1500    ROS:  Review of Systems  Constitutional: Negative for fever, chills and fatigue.  Respiratory: Negative for shortness of breath.   Cardiovascular: Negative for chest pain.  Genitourinary: Negative for dysuria, flank pain, vaginal bleeding, vaginal discharge, difficulty urinating,  vaginal pain and pelvic pain.  Neurological: Positive for headaches. Negative for dizziness.  Psychiatric/Behavioral: Negative.      I have reviewed patient's Past Medical Hx, Surgical Hx, Family Hx, Social Hx, medications and allergies.   Physical Exam   Patient Vitals for the past 24 hrs:  BP Temp Temp src Pulse Resp SpO2 Height Weight  11/02/15 0233 137/61 mmHg 97.3 F (36.3 C) Oral 89 16 - - -  11/02/15 0025 115/59 mmHg 98.4 F (36.9 C) Oral 73 16 - - -  11/01/15 2335 - - - 84 - 100 % - -  11/01/15 2330 - - - 84 - 99 % - -  11/01/15 2325 - - - 82 - 100 % - -  11/01/15 2320 - - - 76 - 98 % - -  11/01/15 2316 - - - 78 - - - -  11/01/15 2315 145/77 mmHg - - 82 - 100 % - -  11/01/15 2310 - - - 80 - 100 % - -  11/01/15 2305 - - - 79 - 99 % - -  11/01/15 2300 - - - 81 - 99 % - -  11/01/15 2255 - - - 81 - 99  % - -  11/01/15 2253 145/87 mmHg - - 78 - - - -  11/01/15 2250 148/90 mmHg 98.3 F (36.8 C) - 80 20 100 % 5\' 3"  (1.6 m) 265 lb (120.203 kg)   Constitutional: Well-developed, well-nourished female in no acute distress.  HEART: normal rate, heart sounds, regular rhythm RESP: normal effort, lung sounds clear and equal bilaterally GI: Abd soft, non-tender, gravid appropriate for gestational age.  MS: Extremities nontender, no edema, normal ROM Neurologic: Alert and oriented x 4.  GU: Neg CVAT.     FHT:  Baseline 140 , moderate variability, accelerations present, isolated variable lasting <30 seconds Contractions: None on toco or to palpation   Labs: Results for orders placed or performed during the hospital encounter of 11/01/15 (from the past 24 hour(s))  Urinalysis, Routine w reflex microscopic (not at [facility])     Status: Abnormal   Collection Time: 11/01/15 10:40 PM  Result Value Ref Range   Color, Urine YELLOW YELLOW   APPearance CLEAR CLEAR   Specific Gravity, Urine >1.030 (H) 1.005 - 1.030   pH 6.0 5.0 - 8.0   Glucose, UA NEGATIVE NEGATIVE mg/dL   Hgb urine dipstick TRACE (A) NEGATIVE   Bilirubin Urine NEGATIVE NEGATIVE   Ketones, ur NEGATIVE NEGATIVE mg/dL   Protein, ur NEGATIVE NEGATIVE mg/dL   Nitrite NEGATIVE NEGATIVE   Leukocytes, UA TRACE (A) NEGATIVE  Protein / creatinine ratio, urine     Status: None   Collection Time: 11/01/15 10:40 PM  Result Value Ref Range   Creatinine, Urine 314.00 mg/dL   Total Protein, Urine 41 mg/dL   Protein Creatinine Ratio 0.13 0.00 - 0.15 mg/mg[Cre]  Urine microscopic-add on     Status: Abnormal   Collection Time: 11/01/15 10:40 PM  Result Value Ref Range   Squamous Epithelial / LPF 6-30 (A) NONE SEEN   WBC, UA 0-5 0 - 5 WBC/hpf   RBC / HPF 0-5 0 - 5 RBC/hpf   Bacteria, UA FEW (A) NONE SEEN  CBC     Status: Abnormal   Collection Time: 11/02/15 12:10 AM  Result Value Ref Range   WBC 9.7 4.0 - 10.5 K/uL   RBC 3.89 3.87 - 5.11  MIL/uL   Hemoglobin 10.1 (L) 12.0 - 15.0 g/dL  HCT 30.9 (L) 36.0 - 46.0 %   MCV 79.4 78.0 - 100.0 fL   MCH 26.0 26.0 - 34.0 pg   MCHC 32.7 30.0 - 36.0 g/dL   RDW 15.6 (H) 11.5 - 15.5 %   Platelets 241 150 - 400 K/uL  Comprehensive metabolic panel     Status: Abnormal   Collection Time: 11/02/15 12:10 AM  Result Value Ref Range   Sodium 134 (L) 135 - 145 mmol/L   Potassium 3.8 3.5 - 5.1 mmol/L   Chloride 106 101 - 111 mmol/L   CO2 20 (L) 22 - 32 mmol/L   Glucose, Bld 94 65 - 99 mg/dL   BUN 6 6 - 20 mg/dL   Creatinine, Ser 0.71 0.44 - 1.00 mg/dL   Calcium 8.7 (L) 8.9 - 10.3 mg/dL   Total Protein 6.0 (L) 6.5 - 8.1 g/dL   Albumin 2.7 (L) 3.5 - 5.0 g/dL   AST 15 15 - 41 U/L   ALT 9 (L) 14 - 54 U/L   Alkaline Phosphatase 81 38 - 126 U/L   Total Bilirubin 0.2 (L) 0.3 - 1.2 mg/dL   GFR calc non Af Amer >60 >60 mL/min   GFR calc Af Amer >60 >60 mL/min   Anion gap 8 5 - 15  Uric acid     Status: None   Collection Time: 11/02/15 12:10 AM  Result Value Ref Range   Uric Acid, Serum 3.6 2.3 - 6.6 mg/dL  Lactate dehydrogenase     Status: None   Collection Time: 11/02/15 12:10 AM  Result Value Ref Range   LDH 161 98 - 192 U/L    MAU Course/MDM: I have ordered labs and reviewed results. Reviewed FHR tracing. Consult Dr Corinna Capra.  URI, likely viral.  With clear lungs, no evidence of pneumonia.  Flu unlikely without fever but pt with chills at home and Tylenol could mask fever.  Offer Tamiflu Rx, pt to start tomorrow if symptoms persist.  Flu swab pending.  Treatments in MAU included LR x 1000 ml.  Pt stable at time of discharge.  Assessment: 1. Acute upper respiratory infection   2. Shortness of breath due to pregnancy, third trimester   3. Chronic hypertension during pregnancy, antepartum     Plan: Discharge home Labor precautions and fetal kick counts Preeclampsia precautions given Tamiflu Rx, 75 mg BID x 5 days Pt given list of safe OTC medications in pregnancy      Follow-up  Information    Follow up with LOWE,DAVID C, MD.   Specialty:  Obstetrics and Gynecology   Why:  As scheduled, Return to MAU as needed for emergencies   Contact information:   [address] [place] [place] [address] [phone]        Medication List    TAKE these medications        acetaminophen 500 MG tablet  Commonly known as:  TYLENOL  Take 1,000 mg by mouth every 6 (six) hours as needed for headache.     cyclobenzaprine 5 MG tablet  Commonly known as:  FLEXERIL  Take 1 tablet (5 mg total) by mouth every 8 (eight) hours as needed for muscle spasms.     labetalol 100 MG tablet  Commonly known as:  NORMODYNE  Take 100 mg by mouth 2 (two) times daily.     oseltamivir 75 MG capsule  Commonly known as:  TAMIFLU  Take 1 capsule (75 mg total) by mouth every 12 (twelve) hours.  OVER THE COUNTER MEDICATION  TYLENOL COLD & COUGH     PRENATAL PLUS DHA PO  Take 2 tablets by mouth daily.        Fatima Blank Certified Nurse-Midwife 11/02/2015 2:49 AM

## 2015-11-02 DIAGNOSIS — O99513 Diseases of the respiratory system complicating pregnancy, third trimester: Secondary | ICD-10-CM | POA: Diagnosis not present

## 2015-11-02 DIAGNOSIS — O26893 Other specified pregnancy related conditions, third trimester: Secondary | ICD-10-CM | POA: Diagnosis not present

## 2015-11-02 DIAGNOSIS — J069 Acute upper respiratory infection, unspecified: Secondary | ICD-10-CM

## 2015-11-02 DIAGNOSIS — R0600 Dyspnea, unspecified: Secondary | ICD-10-CM | POA: Diagnosis not present

## 2015-11-02 LAB — COMPREHENSIVE METABOLIC PANEL
ALT: 9 U/L — ABNORMAL LOW (ref 14–54)
AST: 15 U/L (ref 15–41)
Albumin: 2.7 g/dL — ABNORMAL LOW (ref 3.5–5.0)
Alkaline Phosphatase: 81 U/L (ref 38–126)
Anion gap: 8 (ref 5–15)
BUN: 6 mg/dL (ref 6–20)
CO2: 20 mmol/L — ABNORMAL LOW (ref 22–32)
Calcium: 8.7 mg/dL — ABNORMAL LOW (ref 8.9–10.3)
Chloride: 106 mmol/L (ref 101–111)
Creatinine, Ser: 0.71 mg/dL (ref 0.44–1.00)
GFR calc Af Amer: >60 mL/min (ref >60)
GFR calc non Af Amer: >60 mL/min (ref >60)
Glucose, Bld: 94 mg/dL (ref 65–99)
Potassium: 3.8 mmol/L (ref 3.5–5.1)
Sodium: 134 mmol/L — ABNORMAL LOW (ref 135–145)
Total Bilirubin: 0.2 mg/dL — ABNORMAL LOW (ref 0.3–1.2)
Total Protein: 6 g/dL — ABNORMAL LOW (ref 6.5–8.1)

## 2015-11-02 LAB — INFLUENZA PANEL BY PCR (TYPE A & B)
H1N1 flu by pcr: NOT DETECTED
INFLAPCR: NEGATIVE
INFLBPCR: NEGATIVE

## 2015-11-02 LAB — CBC
HCT: 30.9 % — ABNORMAL LOW (ref 36.0–46.0)
Hemoglobin: 10.1 g/dL — ABNORMAL LOW (ref 12.0–15.0)
MCH: 26 pg (ref 26.0–34.0)
MCHC: 32.7 g/dL (ref 30.0–36.0)
MCV: 79.4 fL (ref 78.0–100.0)
Platelets: 241 10*3/uL (ref 150–400)
RBC: 3.89 MIL/uL (ref 3.87–5.11)
RDW: 15.6 % — ABNORMAL HIGH (ref 11.5–15.5)
WBC: 9.7 10*3/uL (ref 4.0–10.5)

## 2015-11-02 LAB — URINALYSIS, ROUTINE W REFLEX MICROSCOPIC
Bilirubin Urine: NEGATIVE
Glucose, UA: NEGATIVE mg/dL
KETONES UR: NEGATIVE mg/dL
NITRITE: NEGATIVE
PH: 6 (ref 5.0–8.0)
Protein, ur: NEGATIVE mg/dL

## 2015-11-02 LAB — URINE MICROSCOPIC-ADD ON

## 2015-11-02 LAB — PROTEIN / CREATININE RATIO, URINE
Creatinine, Urine: 314 mg/dL
Protein Creatinine Ratio: 0.13 mg/mg{Cre} (ref 0.00–0.15)
Total Protein, Urine: 41 mg/dL

## 2015-11-02 LAB — URIC ACID: Uric Acid, Serum: 3.6 mg/dL (ref 2.3–6.6)

## 2015-11-02 LAB — LACTATE DEHYDROGENASE: LDH: 161 U/L (ref 98–192)

## 2015-11-02 MED ORDER — OSELTAMIVIR PHOSPHATE 75 MG PO CAPS: 75.0000 mg | ORAL_CAPSULE | Freq: Two times a day (BID) | ORAL | Status: DC

## 2015-11-02 NOTE — Discharge Instructions (Signed)
Viral Infections °A viral infection can be caused by different types of viruses. Most viral infections are not serious and resolve on their own. However, some infections may cause severe symptoms and may lead to further complications. °SYMPTOMS °Viruses can frequently cause: °· Minor sore throat. °· Aches and pains. °· Headaches. °· Runny nose. °· Different types of rashes. °· Watery eyes. °· Tiredness. °· Cough. °· Loss of appetite. °· Gastrointestinal infections, resulting in nausea, vomiting, and diarrhea. °These symptoms do not respond to antibiotics because the infection is not caused by bacteria. However, you might catch a bacterial infection following the viral infection. This is sometimes called a "superinfection." Symptoms of such a bacterial infection may include: °· Worsening sore throat with pus and difficulty swallowing. °· Swollen neck glands. °· Chills and a high or persistent fever. °· Severe headache. °· Tenderness over the sinuses. °· Persistent overall ill feeling (malaise), muscle aches, and tiredness (fatigue). °· Persistent cough. °· Yellow, green, or brown mucus production with coughing. °HOME CARE INSTRUCTIONS  °· Only take over-the-counter or prescription medicines for pain, discomfort, diarrhea, or fever as directed by your caregiver. °· Drink enough water and fluids to keep your urine clear or pale yellow. Sports drinks can provide valuable electrolytes, sugars, and hydration. °· Get plenty of rest and maintain proper nutrition. Soups and broths with crackers or rice are fine. °SEEK IMMEDIATE MEDICAL CARE IF:  °· You have severe headaches, shortness of breath, chest pain, neck pain, or an unusual rash. °· You have uncontrolled vomiting, diarrhea, or you are unable to keep down fluids. °· You or your child has an oral temperature above 102° F (38.9° C), not controlled by medicine. °· Your baby is older than 3 months with a rectal temperature of 102° F (38.9° C) or higher. °· Your baby is 3  months old or younger with a rectal temperature of 100.4° F (38° C) or higher. °MAKE SURE YOU:  °· Understand these instructions. °· Will watch your condition. °· Will get help right away if you are not doing well or get worse. °  °This information is not intended to replace advice given to you by your health care provider. Make sure you discuss any questions you have with your health care provider. °  °Document Released: 08/05/2005 Document Revised: 01/18/2012 Document Reviewed: 04/03/2015 °Elsevier Interactive Patient Education ©2016 [employer] ° °

## 2015-11-10 NOTE — L&D Delivery Note (Signed)
SVD of VMI at 2134 on 12/09/15.  EBL 150cc.  APGARs 8,9.  Placenta to L&D. Head delivered OA and body delivered atraumatically through loose nuchal.  Cord was clamped, cut and baby to abdomen.  Cord blood was obtained.  Placenta delivered S/I/3VC; true knot.  Fundus was firmed with pitocin and massage.  Perineum intact.  Mom and baby stable.  Linda Hedges, DO

## 2015-12-03 LAB — OB RESULTS CONSOLE GBS: GBS: POSITIVE

## 2015-12-04 ENCOUNTER — Encounter (HOSPITAL_COMMUNITY): Payer: Self-pay | Admitting: *Deleted

## 2015-12-04 ENCOUNTER — Telehealth (HOSPITAL_COMMUNITY): Payer: Self-pay | Admitting: *Deleted

## 2015-12-04 NOTE — Telephone Encounter (Signed)
Preadmission screen  

## 2015-12-09 ENCOUNTER — Inpatient Hospital Stay (HOSPITAL_COMMUNITY): Payer: 59 | Admitting: Anesthesiology

## 2015-12-09 ENCOUNTER — Encounter (HOSPITAL_COMMUNITY): Payer: Self-pay

## 2015-12-09 ENCOUNTER — Inpatient Hospital Stay (HOSPITAL_COMMUNITY)
Admission: RE | Admit: 2015-12-09 | Discharge: 2015-12-11 | DRG: 774 | Disposition: A | Discharge status: Home / Self Care | Payer: 59 | Source: Ambulatory Visit | Attending: Obstetrics & Gynecology | Admitting: Obstetrics & Gynecology

## 2015-12-09 DIAGNOSIS — Z833 Family history of diabetes mellitus: Secondary | ICD-10-CM

## 2015-12-09 DIAGNOSIS — O1092 Unspecified pre-existing hypertension complicating childbirth: Secondary | ICD-10-CM | POA: Diagnosis present

## 2015-12-09 DIAGNOSIS — Z87891 Personal history of nicotine dependence: Secondary | ICD-10-CM | POA: Diagnosis not present

## 2015-12-09 DIAGNOSIS — Z8249 Family history of ischemic heart disease and other diseases of the circulatory system: Secondary | ICD-10-CM | POA: Diagnosis not present

## 2015-12-09 DIAGNOSIS — O10919 Unspecified pre-existing hypertension complicating pregnancy, unspecified trimester: Secondary | ICD-10-CM | POA: Diagnosis present

## 2015-12-09 DIAGNOSIS — O99824 Streptococcus B carrier state complicating childbirth: Secondary | ICD-10-CM | POA: Diagnosis present

## 2015-12-09 DIAGNOSIS — Z3A38 38 weeks gestation of pregnancy: Secondary | ICD-10-CM

## 2015-12-09 HISTORY — DX: Other specified postprocedural states: Z98.890

## 2015-12-09 LAB — COMPREHENSIVE METABOLIC PANEL
ALT: 14 U/L (ref 14–54)
AST: 23 U/L (ref 15–41)
Albumin: 2.9 g/dL — ABNORMAL LOW (ref 3.5–5.0)
Alkaline Phosphatase: 97 U/L (ref 38–126)
Anion gap: 7 (ref 5–15)
BILIRUBIN TOTAL: 0.4 mg/dL (ref 0.3–1.2)
BUN: 12 mg/dL (ref 6–20)
CHLORIDE: 107 mmol/L (ref 101–111)
CO2: 21 mmol/L — ABNORMAL LOW (ref 22–32)
CREATININE: 0.81 mg/dL (ref 0.44–1.00)
Calcium: 9.1 mg/dL (ref 8.9–10.3)
Glucose, Bld: 97 mg/dL (ref 65–99)
Potassium: 4 mmol/L (ref 3.5–5.1)
Sodium: 135 mmol/L (ref 135–145)
TOTAL PROTEIN: 6.3 g/dL — AB (ref 6.5–8.1)

## 2015-12-09 LAB — CBC
HCT: 32.5 % — ABNORMAL LOW (ref 36.0–46.0)
Hemoglobin: 10.7 g/dL — ABNORMAL LOW (ref 12.0–15.0)
MCH: 25.6 pg — ABNORMAL LOW (ref 26.0–34.0)
MCHC: 32.9 g/dL (ref 30.0–36.0)
MCV: 77.8 fL — ABNORMAL LOW (ref 78.0–100.0)
PLATELETS: 228 10*3/uL (ref 150–400)
RBC: 4.18 MIL/uL (ref 3.87–5.11)
RDW: 16.2 % — AB (ref 11.5–15.5)
WBC: 10.2 10*3/uL (ref 4.0–10.5)

## 2015-12-09 LAB — TYPE AND SCREEN
ABO/RH(D): O POS
ANTIBODY SCREEN: NEGATIVE

## 2015-12-09 MED ORDER — OXYCODONE-ACETAMINOPHEN 5-325 MG PO TABS: 2.0000 | ORAL_TABLET | ORAL | Status: DC | PRN

## 2015-12-09 MED ORDER — LABETALOL HCL 100 MG PO TABS
100.0000 mg | ORAL_TABLET | Freq: Two times a day (BID) | ORAL | Status: DC
  Administered 2015-12-09 – 2015-12-11 (×4): 100 mg via ORAL
  Filled 2015-12-09 (×5): qty 1

## 2015-12-09 MED ORDER — LACTATED RINGERS IV SOLN: INTRAVENOUS | Status: DC

## 2015-12-09 MED ORDER — LACTATED RINGERS IV SOLN
INTRAVENOUS | Status: DC
  Administered 2015-12-09: 16:00:00 via INTRAUTERINE

## 2015-12-09 MED ORDER — PENICILLIN G POTASSIUM 5000000 UNITS IJ SOLR
2.5000 10*6.[IU] | INTRAVENOUS | Status: DC
  Administered 2015-12-09 (×4): 2.5 10*6.[IU] via INTRAVENOUS
  Filled 2015-12-09 (×7): qty 2.5

## 2015-12-09 MED ORDER — OXYTOCIN 10 UNIT/ML IJ SOLN
2.5000 [IU]/h | INTRAVENOUS | Status: DC
  Filled 2015-12-09: qty 4

## 2015-12-09 MED ORDER — OXYCODONE-ACETAMINOPHEN 5-325 MG PO TABS: 1.0000 | ORAL_TABLET | ORAL | Status: DC | PRN

## 2015-12-09 MED ORDER — LIDOCAINE HCL (PF) 1 % IJ SOLN
30.0000 mL | INTRAMUSCULAR | Status: DC | PRN
  Filled 2015-12-09: qty 30

## 2015-12-09 MED ORDER — OXYTOCIN 10 UNIT/ML IJ SOLN
1.0000 m[IU]/min | INTRAVENOUS | Status: DC
  Administered 2015-12-09: 2 m[IU]/min via INTRAVENOUS

## 2015-12-09 MED ORDER — LACTATED RINGERS IV SOLN
INTRAVENOUS | Status: DC
  Administered 2015-12-09 (×3): via INTRAVENOUS
  Administered 2015-12-09: 300 mL via INTRAVENOUS

## 2015-12-09 MED ORDER — PENICILLIN G POTASSIUM 5000000 UNITS IJ SOLR
5.0000 10*6.[IU] | Freq: Once | INTRAVENOUS | Status: AC
  Administered 2015-12-09: 5 10*6.[IU] via INTRAVENOUS
  Filled 2015-12-09: qty 5

## 2015-12-09 MED ORDER — ZOLPIDEM TARTRATE 5 MG PO TABS
5.0000 mg | ORAL_TABLET | Freq: Every evening | ORAL | Status: DC | PRN
  Administered 2015-12-09: 5 mg via ORAL
  Filled 2015-12-09: qty 1

## 2015-12-09 MED ORDER — DIPHENHYDRAMINE HCL 50 MG/ML IJ SOLN: 12.5000 mg | INTRAMUSCULAR | Status: DC | PRN

## 2015-12-09 MED ORDER — FAMOTIDINE 20 MG PO TABS
40.0000 mg | ORAL_TABLET | Freq: Once | ORAL | Status: DC
  Filled 2015-12-09: qty 2

## 2015-12-09 MED ORDER — MISOPROSTOL 25 MCG QUARTER TABLET
25.0000 ug | ORAL_TABLET | ORAL | Status: DC | PRN
  Administered 2015-12-09: 25 ug via VAGINAL
  Filled 2015-12-09: qty 1
  Filled 2015-12-09: qty 0.25

## 2015-12-09 MED ORDER — LACTATED RINGERS IV SOLN: 500.0000 mL | INTRAVENOUS | Status: DC | PRN

## 2015-12-09 MED ORDER — FENTANYL 2.5 MCG/ML BUPIVACAINE 1/10 % EPIDURAL INFUSION (WH - ANES)
14.0000 mL/h | INTRAMUSCULAR | Status: DC | PRN
  Administered 2015-12-09 (×3): 14 mL/h via EPIDURAL
  Filled 2015-12-09 (×2): qty 125

## 2015-12-09 MED ORDER — OXYTOCIN BOLUS FROM INFUSION: 500.0000 mL | INTRAVENOUS | Status: DC

## 2015-12-09 MED ORDER — ACETAMINOPHEN 325 MG PO TABS: 650.0000 mg | ORAL_TABLET | ORAL | Status: DC | PRN

## 2015-12-09 MED ORDER — BUTORPHANOL TARTRATE 1 MG/ML IJ SOLN: 1.0000 mg | INTRAMUSCULAR | Status: DC | PRN

## 2015-12-09 MED ORDER — ONDANSETRON HCL 4 MG/2ML IJ SOLN: 4.0000 mg | Freq: Four times a day (QID) | INTRAMUSCULAR | Status: DC | PRN

## 2015-12-09 MED ORDER — CITRIC ACID-SODIUM CITRATE 334-500 MG/5ML PO SOLN: 30.0000 mL | ORAL | Status: DC | PRN

## 2015-12-09 MED ORDER — PHENYLEPHRINE 40 MCG/ML (10ML) SYRINGE FOR IV PUSH (FOR BLOOD PRESSURE SUPPORT)
80.0000 ug | PREFILLED_SYRINGE | INTRAVENOUS | Status: DC | PRN
  Filled 2015-12-09: qty 20
  Filled 2015-12-09: qty 2

## 2015-12-09 MED ORDER — METOCLOPRAMIDE HCL 10 MG PO TABS
10.0000 mg | ORAL_TABLET | Freq: Once | ORAL | Status: DC
  Filled 2015-12-09: qty 1

## 2015-12-09 MED ORDER — LIDOCAINE HCL (PF) 1 % IJ SOLN
INTRAMUSCULAR | Status: DC | PRN
  Administered 2015-12-09 (×2): 4 mL via EPIDURAL

## 2015-12-09 MED ORDER — TERBUTALINE SULFATE 1 MG/ML IJ SOLN
0.2500 mg | Freq: Once | INTRAMUSCULAR | Status: DC | PRN
  Filled 2015-12-09: qty 1

## 2015-12-09 MED ORDER — EPHEDRINE 5 MG/ML INJ
10.0000 mg | INTRAVENOUS | Status: DC | PRN
  Filled 2015-12-09: qty 2

## 2015-12-09 MED ORDER — FLEET ENEMA 7-19 GM/118ML RE ENEM: 1.0000 | ENEMA | RECTAL | Status: DC | PRN

## 2015-12-09 NOTE — H&P (Signed)
Michelle Duarte is a 35 y.o. female presenting for induction of labor secondary to [facility]; controlled on labetalol.  Antepartum course is otherwise uncomplicated.  Patient denies HA, CP/SOB, RUQ pain, or visual disturbance.  GBS positive.  The patient received a single dose of VMP overnight.  Maternal Medical History:  Fetal activity: Perceived fetal activity is normal.   Last perceived fetal movement was within the past hour.    Prenatal complications: PIH.   Prenatal Complications - Diabetes: none.    OB History    Gravida Para Term Preterm AB TAB SAB Ectopic Multiple Living   4 1 1  2 1 1   2      Past Medical History  Diagnosis Date  . Abnormal Pap smear 2011  . Hypertension   . Vaginal Pap smear, abnormal    Past Surgical History  Procedure Laterality Date  . Breast surgery      reduction  . Wisdom tooth extraction    . Therapeutic abortion  2000  . Dilation and curettage of uterus      with polypectomy   Family History: family history includes Diabetes in her paternal aunt and paternal grandmother; Drug abuse in her mother; Hypertension in her maternal aunt, maternal grandmother, and mother. Social History:  reports that she has quit smoking. She does not have any smokeless tobacco history on file. She reports that she does not drink alcohol or use illicit drugs.   Prenatal Transfer Tool  Maternal Diabetes: No Genetic Screening: Normal Maternal Ultrasounds/Referrals: Normal Fetal Ultrasounds or other Referrals:  None Maternal Substance Abuse:  No Significant Maternal Medications:  Meds include: Other: labetalol Significant Maternal Lab Results:  Lab values include: Group B Strep positive Other Comments:  None  ROS  Dilation: 1 Effacement (%): 50 Station: -3 Exam by:: dr Michelle Duarte Blood pressure 125/75, pulse 86, temperature 98.5 F (36.9 C), temperature source Oral, resp. rate 20, height 5\' 4"  (1.626 m), weight 123.378 kg (272 lb), last menstrual period 03/11/2015,  SpO2 98 %. Maternal Exam:  Uterine Assessment: Contraction strength is mild.  Contraction frequency is rare.   Abdomen: Patient reports no abdominal tenderness. Fundal height is c/w dates.   Estimated fetal weight is 8#.       Physical Exam  Constitutional: She is oriented to person, place, and time. She appears well-developed and well-nourished.  GI: Soft. There is no rebound and no guarding.  Neurological: She is alert and oriented to person, place, and time.  Skin: Skin is warm and dry.  Psychiatric: She has a normal mood and affect. Her behavior is normal.    Prenatal labs: ABO, Rh: --/--/O POS (01/30 0100) Antibody: NEG (01/30 0100) Rubella: Immune (06/30 0000) RPR: Nonreactive (06/30 0000)  HBsAg: Negative (06/30 0000)  HIV: Non-reactive (06/30 0000)  GBS: Positive (01/24 0000)   Assessment/Plan: 35yo [id number] at 53 weeks with CHTN -Continue IOL with adding pitocin, AROM when able -Epidural when desired -Anticipate NSVD   Michelle Duarte 12/09/2015, 8:02 AM

## 2015-12-09 NOTE — Anesthesia Preprocedure Evaluation (Signed)
Anesthesia Evaluation  Patient identified by MRN, date of birth, ID band Patient awake    Reviewed: Allergy & Precautions, NPO status , Patient's Chart, lab work & pertinent test results, reviewed documented beta blocker date and time   Airway Mallampati: III  TM Distance: >3 FB Neck ROM: Full    Dental  (+) Teeth Intact   Pulmonary former smoker,    breath sounds clear to auscultation- rhonchi       Cardiovascular hypertension, Pt. on medications and Pt. on home beta blockers  Rhythm:Regular Rate:Normal     Neuro/Psych negative neurological ROS  negative psych ROS   GI/Hepatic negative GI ROS, Neg liver ROS,   Endo/Other  negative endocrine ROS  Renal/GU negative Renal ROS  negative genitourinary   Musculoskeletal negative musculoskeletal ROS (+)   Abdominal   Peds negative pediatric ROS (+)  Hematology negative hematology ROS (+)   Anesthesia Other Findings   Reproductive/Obstetrics (+) Pregnancy                             Lab Results  Component Value Date   WBC 10.2 12/09/2015   HGB 10.7* 12/09/2015   HCT 32.5* 12/09/2015   MCV 77.8* 12/09/2015   PLT 228 12/09/2015   Lab Results  Component Value Date   CREATININE 0.81 12/09/2015   BUN 12 12/09/2015   NA 135 12/09/2015   K 4.0 12/09/2015   CL 107 12/09/2015   CO2 21* 12/09/2015   No results found for: INR, PROTIME   Anesthesia Physical Anesthesia Plan  ASA: III  Anesthesia Plan: Epidural   Post-op Pain Management:    Induction:   Airway Management Planned:   Additional Equipment:   Intra-op Plan:   Post-operative Plan:   Informed Consent: I have reviewed the patients History and Physical, chart, labs and discussed the procedure including the risks, benefits and alternatives for the proposed anesthesia with the patient or authorized representative who has indicated his/her understanding and acceptance.      Plan Discussed with:   Anesthesia Plan Comments:         Anesthesia Quick Evaluation

## 2015-12-09 NOTE — Anesthesia Procedure Notes (Signed)
Epidural Patient location during procedure: OB Start time: 12/09/2015 2:48 PM End time: 12/09/2015 2:55 PM  Staffing Anesthesiologist: Suella Broad D Performed by: anesthesiologist   Preanesthetic Checklist Completed: patient identified, site marked, surgical consent, pre-op evaluation, timeout performed, IV checked, risks and benefits discussed and monitors and equipment checked  Epidural Patient position: sitting Prep: ChloraPrep Patient monitoring: heart rate, continuous pulse ox and blood pressure Approach: midline Location: L3-L4 Injection technique: LOR saline  Needle:  Needle type: Tuohy  Needle gauge: 17 G Needle length: 9 cm Catheter type: closed end flexible Catheter size: 20 Guage Test dose: negative and 1.5% lidocaine  Assessment Events: blood not aspirated, injection not painful, no injection resistance and no paresthesia  Additional Notes LOR @ 6  Patient identified. Risks/Benefits/Options discussed with patient including but not limited to bleeding, infection, nerve damage, paralysis, failed block, incomplete pain control, headache, blood pressure changes, nausea, vomiting, reactions to medications, itching and postpartum back pain. Confirmed with bedside nurse the patient's most recent platelet count. Confirmed with patient that they are not currently taking any anticoagulation, have any bleeding history or any family history of bleeding disorders. Patient expressed understanding and wished to proceed. All questions were answered. Sterile technique was used throughout the entire procedure. Please see nursing notes for vital signs. Test dose was given through epidural catheter and negative prior to continuing to dose epidural or start infusion. Warning signs of high block given to the patient including shortness of breath, tingling/numbness in hands, complete motor block, or any concerning symptoms with instructions to call for help. Patient was given instructions on  fall risk and not to get out of bed. All questions and concerns addressed with instructions to call with any issues or inadequate analgesia.    Reason for block:procedure for pain

## 2015-12-10 ENCOUNTER — Encounter (HOSPITAL_COMMUNITY): Payer: Self-pay

## 2015-12-10 ENCOUNTER — Encounter (HOSPITAL_COMMUNITY)
Admission: RE | Disposition: A | Discharge status: Home / Self Care | Payer: Self-pay | Source: Ambulatory Visit | Attending: Obstetrics & Gynecology

## 2015-12-10 LAB — CBC
HCT: 28.1 % — ABNORMAL LOW (ref 36.0–46.0)
HEMOGLOBIN: 9.1 g/dL — AB (ref 12.0–15.0)
MCH: 25.4 pg — ABNORMAL LOW (ref 26.0–34.0)
MCHC: 32.4 g/dL (ref 30.0–36.0)
MCV: 78.5 fL (ref 78.0–100.0)
Platelets: 209 10*3/uL (ref 150–400)
RBC: 3.58 MIL/uL — AB (ref 3.87–5.11)
RDW: 16.5 % — ABNORMAL HIGH (ref 11.5–15.5)
WBC: 12.5 10*3/uL — AB (ref 4.0–10.5)

## 2015-12-10 LAB — RPR: RPR Ser Ql: NONREACTIVE

## 2015-12-10 SURGERY — LIGATION, FALLOPIAN TUBE, POSTPARTUM
Anesthesia: Epidural | Laterality: Bilateral

## 2015-12-10 MED ORDER — LANOLIN HYDROUS EX OINT: TOPICAL_OINTMENT | CUTANEOUS | Status: DC | PRN

## 2015-12-10 MED ORDER — ACETAMINOPHEN 325 MG PO TABS
650.0000 mg | ORAL_TABLET | ORAL | Status: DC | PRN
  Administered 2015-12-10: 650 mg via ORAL
  Filled 2015-12-10: qty 2

## 2015-12-10 MED ORDER — ONDANSETRON HCL 4 MG/2ML IJ SOLN: 4.0000 mg | INTRAMUSCULAR | Status: DC | PRN

## 2015-12-10 MED ORDER — ZOLPIDEM TARTRATE 5 MG PO TABS: 5.0000 mg | ORAL_TABLET | Freq: Every evening | ORAL | Status: DC | PRN

## 2015-12-10 MED ORDER — DIBUCAINE 1 % RE OINT: 1.0000 "application " | TOPICAL_OINTMENT | RECTAL | Status: DC | PRN

## 2015-12-10 MED ORDER — SIMETHICONE 80 MG PO CHEW: 80.0000 mg | CHEWABLE_TABLET | ORAL | Status: DC | PRN

## 2015-12-10 MED ORDER — WITCH HAZEL-GLYCERIN EX PADS: 1.0000 "application " | MEDICATED_PAD | CUTANEOUS | Status: DC | PRN

## 2015-12-10 MED ORDER — OXYCODONE-ACETAMINOPHEN 5-325 MG PO TABS: 2.0000 | ORAL_TABLET | ORAL | Status: DC | PRN

## 2015-12-10 MED ORDER — PRENATAL MULTIVITAMIN CH
1.0000 | ORAL_TABLET | Freq: Every day | ORAL | Status: DC
  Administered 2015-12-10: 1 via ORAL
  Filled 2015-12-10 (×2): qty 1

## 2015-12-10 MED ORDER — IBUPROFEN 600 MG PO TABS
600.0000 mg | ORAL_TABLET | Freq: Four times a day (QID) | ORAL | Status: DC
  Administered 2015-12-10 – 2015-12-11 (×6): 600 mg via ORAL
  Filled 2015-12-10 (×7): qty 1

## 2015-12-10 MED ORDER — SENNOSIDES-DOCUSATE SODIUM 8.6-50 MG PO TABS
2.0000 | ORAL_TABLET | ORAL | Status: DC
  Administered 2015-12-10 (×2): 2 via ORAL
  Filled 2015-12-10 (×2): qty 2

## 2015-12-10 MED ORDER — OXYCODONE-ACETAMINOPHEN 5-325 MG PO TABS
1.0000 | ORAL_TABLET | ORAL | Status: DC | PRN
Start: 2015-12-10 — End: 2015-12-11

## 2015-12-10 MED ORDER — BENZOCAINE-MENTHOL 20-0.5 % EX AERO: 1.0000 "application " | INHALATION_SPRAY | CUTANEOUS | Status: DC | PRN

## 2015-12-10 MED ORDER — ONDANSETRON HCL 4 MG PO TABS: 4.0000 mg | ORAL_TABLET | ORAL | Status: DC | PRN

## 2015-12-10 MED ORDER — TETANUS-DIPHTH-ACELL PERTUSSIS 5-2.5-18.5 LF-MCG/0.5 IM SUSP: 0.5000 mL | Freq: Once | INTRAMUSCULAR | Status: DC

## 2015-12-10 NOTE — Progress Notes (Signed)
Post Partum Day 1 Subjective: no complaints, up ad lib, voiding, tolerating PO and patient had previously considered BTL this am. She reports today that she has decided to have Mirena or nexplanon pp  Objective: Blood pressure 113/70, pulse 82, temperature 98.8 F (37.1 C), temperature source Oral, resp. rate 16, height 5\' 4"  (1.626 m), weight 272 lb (123.378 kg), last menstrual period 03/11/2015, SpO2 98 %, unknown if currently breastfeeding.  Physical Exam:  General: alert and cooperative Lochia: appropriate Uterine Fundus: firm Incision: perineum intact DVT Evaluation: No evidence of DVT seen on physical exam. Negative Homan's sign. No cords or calf tenderness. No significant calf/ankle edema.   Recent Labs  12/09/15 0100 12/10/15 0538  HGB 10.7* 9.1*  HCT 32.5* 28.1*    Assessment/Plan: Plan for discharge tomorrow and Circumcision prior to discharge   LOS: 1 day   CURTIS,CAROL G 12/10/2015, 8:20 AM

## 2015-12-10 NOTE — Lactation Note (Signed)
This note was copied from the chart of [place]. Lactation Consultation Note  Initial visit made. Breastfeeding consultation services and support information given to patient.  She has a history of breast reduction in 2007.  She states she produced about one half of what baby needed for 6 months.  She has started pumping with symphony and drops of colostrum seen.  Baby latched well initially but has been very spitty and gaggy today.  Baby is currently in nursery for circumcision.  Instructed to watch for feeding cues and call for latch assist prn.  Mom has started supplementing with formula and will pump every 3 hours.  Patient Name: Michelle Duarte [id number] Date: 12/10/2015 Reason for consult: Initial assessment   Maternal Data Formula Feeding for Exclusion: Yes Reason for exclusion: Previous breast surgery (mastectomy, reduction, or augmentation where mother is unable to produce breast milk) Does the patient have breastfeeding experience prior to this delivery?: Yes  Feeding Feeding Type: Bottle Fed - Formula Nipple Type: Slow - flow  LATCH Score/Interventions                      Lactation Tools Discussed/Used Pump Review: Setup, frequency, and cleaning;Milk Storage Initiated by:: RN Date initiated:: 12/10/15   Consult Status Consult Status: Follow-up Date: 12/11/15 Follow-up type: In-patient    Ave Filter 12/10/2015, 5:16 PM

## 2015-12-10 NOTE — Anesthesia Postprocedure Evaluation (Signed)
Anesthesia Post Note  Patient: Michelle Duarte  Procedure(s) Performed: * No procedures listed *  Patient location during evaluation: Mother Baby Anesthesia Type: Epidural Level of consciousness: awake, awake and alert and oriented Pain management: pain level controlled Vital Signs Assessment: post-procedure vital signs reviewed and stable Respiratory status: spontaneous breathing Cardiovascular status: stable Postop Assessment: adequate PO intake, no signs of nausea or vomiting, patient able to bend at knees, no backache and no headache Anesthetic complications: no Comments: Removed Epidural Cath. PLTs are 209    Last Vitals:  Filed Vitals:   12/10/15 0130 12/10/15 0515  BP: 139/6 113/70  Pulse: 86 82  Temp: 37.1 C   Resp: 16 16    Last Pain:  Filed Vitals:   12/10/15 0948  PainSc: Asleep                 Rayvon Char

## 2015-12-11 MED ORDER — IBUPROFEN 600 MG PO TABS: 600.0000 mg | ORAL_TABLET | Freq: Four times a day (QID) | ORAL | Status: DC

## 2015-12-11 MED ORDER — LABETALOL HCL 100 MG PO TABS: 100.0000 mg | ORAL_TABLET | Freq: Two times a day (BID) | ORAL | Status: DC

## 2015-12-11 NOTE — Discharge Summary (Signed)
Obstetric Discharge Summary Reason for Admission: induction of labor Prenatal Procedures: ultrasound Intrapartum Procedures: spontaneous vaginal delivery Postpartum Procedures: none Complications-Operative and Postpartum: none HEMOGLOBIN  Date Value Ref Range Status  12/10/2015 9.1* 12.0 - 15.0 g/dL Final   HCT  Date Value Ref Range Status  12/10/2015 28.1* 36.0 - 46.0 % Final    Physical Exam:  General: alert and cooperative Lochia: appropriate Uterine Fundus: firm Incision: perineum intact DVT Evaluation: No evidence of DVT seen on physical exam. Negative Homan's sign. No cords or calf tenderness.  Discharge Diagnoses: Term Pregnancy-delivered  Discharge Information: Date: 12/11/2015 Activity: pelvic rest Diet: routine Medications: PNV, Ibuprofen and labetalol Condition: stable Instructions: refer to practice specific booklet Discharge to: home. RTO in 1 week for bp check   Newborn Data: Live born female  Birth Weight: 6 lb 12.6 oz (3080 g) APGAR: 8, 9  Home with mother.  Michelle Duarte G 12/11/2015, 8:15 AM

## 2015-12-11 NOTE — Lactation Note (Signed)
This note was copied from the chart of [place]. Lactation Consultation Note  Patient Name: Boy Clark Nick [id number] Date: 12/11/2015 Reason for consult: Follow-up assessment;Breast surgery Mom reports she is putting baby to breast with feedings but not for very long. LC encouraged Mom to breast feed with each feeding, keeping baby on breast 15-20 minutes both breasts when possible. Mom reports she is pumping. Mom reports she is getting DEBP from insurance, advised of 2 week rental program, Mom will advise. Stressed to [employer] the importance of baby nursing with feedings to learn how to breastfeed and also the stimulation is good to maximize Mom's milk production. Hx of Breast reduction. Advised to pump every 3 hours for 15-20 minutes to maximize milk production and to have EBM to supplement. OP f/u scheduled for Friday, 12/20/15 at 10:30. Encouraged to call for questions/concerns. Supplemental guidelines for BF/BO feeding and if pump/bottle feeding given to and reviewed with Mom encouraged to increase amounts per guidelines.   Maternal Data    Feeding Feeding Type: Bottle Fed - Formula Nipple Type: Slow - flow  LATCH Score/Interventions                      Lactation Tools Discussed/Used Tools: Pump Breast pump type: Double-Electric Breast Pump   Consult Status Consult Status: Complete Date: 12/11/15 Follow-up type: In-patient    Katrine Coho 12/11/2015, 11:18 AM

## 2015-12-20 ENCOUNTER — Ambulatory Visit (HOSPITAL_COMMUNITY): Payer: No Typology Code available for payment source

## 2016-02-01 HISTORY — PX: TUBAL LIGATION: SHX77

## 2016-02-06 ENCOUNTER — Emergency Department (HOSPITAL_COMMUNITY): Payer: 59

## 2016-02-06 ENCOUNTER — Encounter (HOSPITAL_COMMUNITY): Payer: Self-pay | Admitting: Emergency Medicine

## 2016-02-06 ENCOUNTER — Emergency Department (HOSPITAL_COMMUNITY)
Admission: EM | Admit: 2016-02-06 | Discharge: 2016-02-06 | Disposition: A | Discharge status: Home / Self Care | Payer: 59 | Attending: Emergency Medicine | Admitting: Emergency Medicine

## 2016-02-06 DIAGNOSIS — J989 Respiratory disorder, unspecified: Secondary | ICD-10-CM | POA: Diagnosis not present

## 2016-02-06 DIAGNOSIS — Z3202 Encounter for pregnancy test, result negative: Secondary | ICD-10-CM | POA: Insufficient documentation

## 2016-02-06 DIAGNOSIS — I10 Essential (primary) hypertension: Secondary | ICD-10-CM | POA: Insufficient documentation

## 2016-02-06 DIAGNOSIS — E876 Hypokalemia: Secondary | ICD-10-CM | POA: Diagnosis not present

## 2016-02-06 DIAGNOSIS — M791 Myalgia: Secondary | ICD-10-CM | POA: Diagnosis not present

## 2016-02-06 DIAGNOSIS — Z87891 Personal history of nicotine dependence: Secondary | ICD-10-CM | POA: Diagnosis not present

## 2016-02-06 DIAGNOSIS — R197 Diarrhea, unspecified: Secondary | ICD-10-CM | POA: Diagnosis not present

## 2016-02-06 DIAGNOSIS — Z79899 Other long term (current) drug therapy: Secondary | ICD-10-CM | POA: Insufficient documentation

## 2016-02-06 DIAGNOSIS — R05 Cough: Secondary | ICD-10-CM

## 2016-02-06 DIAGNOSIS — R058 Other specified cough: Secondary | ICD-10-CM

## 2016-02-06 DIAGNOSIS — R509 Fever, unspecified: Secondary | ICD-10-CM

## 2016-02-06 LAB — BASIC METABOLIC PANEL
Anion gap: 7 (ref 5–15)
CHLORIDE: 105 mmol/L (ref 101–111)
CO2: 28 mmol/L (ref 22–32)
CREATININE: 0.89 mg/dL (ref 0.44–1.00)
Calcium: 8.8 mg/dL — ABNORMAL LOW (ref 8.9–10.3)
GFR calc Af Amer: >60 mL/min (ref >60)
GFR calc non Af Amer: >60 mL/min (ref >60)
GLUCOSE: 87 mg/dL (ref 65–99)
POTASSIUM: 2.9 mmol/L — AB (ref 3.5–5.1)
SODIUM: 140 mmol/L (ref 135–145)

## 2016-02-06 LAB — CBC WITH DIFFERENTIAL/PLATELET
Basophils Absolute: 0 10*3/uL (ref 0.0–0.1)
Basophils Relative: 0 %
EOS ABS: 0.1 10*3/uL (ref 0.0–0.7)
Eosinophils Relative: 1 %
HEMATOCRIT: 36.7 % (ref 36.0–46.0)
HEMOGLOBIN: 11.4 g/dL — AB (ref 12.0–15.0)
LYMPHS ABS: 0.7 10*3/uL (ref 0.7–4.0)
LYMPHS PCT: 11 %
MCH: 24.4 pg — AB (ref 26.0–34.0)
MCHC: 31.1 g/dL (ref 30.0–36.0)
MCV: 78.4 fL (ref 78.0–100.0)
MONOS PCT: 10 %
Monocytes Absolute: 0.6 10*3/uL (ref 0.1–1.0)
NEUTROS PCT: 78 %
Neutro Abs: 4.6 10*3/uL (ref 1.7–7.7)
Platelets: 286 10*3/uL (ref 150–400)
RBC: 4.68 MIL/uL (ref 3.87–5.11)
RDW: 15.8 % — ABNORMAL HIGH (ref 11.5–15.5)
WBC: 6 10*3/uL (ref 4.0–10.5)

## 2016-02-06 LAB — URINALYSIS, ROUTINE W REFLEX MICROSCOPIC
BILIRUBIN URINE: NEGATIVE
Glucose, UA: NEGATIVE mg/dL
HGB URINE DIPSTICK: NEGATIVE
Ketones, ur: NEGATIVE mg/dL
Leukocytes, UA: NEGATIVE
Nitrite: NEGATIVE
PH: 6.5 (ref 5.0–8.0)
Protein, ur: NEGATIVE mg/dL
SPECIFIC GRAVITY, URINE: 1.008 (ref 1.005–1.030)

## 2016-02-06 LAB — POC URINE PREG, ED: PREG TEST UR: NEGATIVE

## 2016-02-06 MED ORDER — BENZONATATE 100 MG PO CAPS: 100.0000 mg | ORAL_CAPSULE | Freq: Three times a day (TID) | ORAL | Status: DC

## 2016-02-06 MED ORDER — POTASSIUM CHLORIDE CRYS ER 20 MEQ PO TBCR
40.0000 meq | EXTENDED_RELEASE_TABLET | Freq: Two times a day (BID) | ORAL | Status: DC
  Administered 2016-02-06: 40 meq via ORAL
  Filled 2016-02-06: qty 2

## 2016-02-06 MED ORDER — ACETAMINOPHEN 325 MG PO TABS
ORAL_TABLET | ORAL | Status: AC
  Administered 2016-02-06: 650 mg via ORAL
  Filled 2016-02-06: qty 2

## 2016-02-06 MED ORDER — POTASSIUM CHLORIDE ER 20 MEQ PO TBCR: 40.0000 meq | EXTENDED_RELEASE_TABLET | Freq: Every day | ORAL | Status: DC

## 2016-02-06 MED ORDER — IBUPROFEN 600 MG PO TABS: 600.0000 mg | ORAL_TABLET | Freq: Four times a day (QID) | ORAL | Status: DC | PRN

## 2016-02-06 MED ORDER — ACETAMINOPHEN 325 MG PO TABS
650.0000 mg | ORAL_TABLET | Freq: Once | ORAL | Status: AC
  Administered 2016-02-06: 650 mg via ORAL

## 2016-02-06 MED ORDER — IBUPROFEN 400 MG PO TABS
600.0000 mg | ORAL_TABLET | Freq: Once | ORAL | Status: AC
  Administered 2016-02-06: 600 mg via ORAL
  Filled 2016-02-06: qty 1

## 2016-02-06 NOTE — ED Notes (Signed)
Pt. presents with productive cough , nasal congestion , chills, fever , generalized body aches onset today .

## 2016-02-06 NOTE — ED Notes (Addendum)
Pt c/o feeling sick since yesterday. States she had shortness of breath and fever. Took over the counter cold and flu.

## 2016-02-06 NOTE — Discharge Instructions (Signed)
Cough, Adult Coughing is a reflex that clears your throat and your airways. Coughing helps to heal and protect your lungs. It is normal to cough occasionally, but a cough that happens with other symptoms or lasts a long time may be a sign of a condition that needs treatment. A cough may last only 2-3 weeks (acute), or it may last longer than 8 weeks (chronic). CAUSES Coughing is commonly caused by:  Breathing in substances that irritate your lungs.  A viral or bacterial respiratory infection.  Allergies.  Asthma.  Postnasal drip.  Smoking.  Acid backing up from the stomach into the esophagus (gastroesophageal reflux).  Certain medicines.  Chronic lung problems, including COPD (or rarely, lung cancer).  Other medical conditions such as heart failure. HOME CARE INSTRUCTIONS  Pay attention to any changes in your symptoms. Take these actions to help with your discomfort:  Take medicines only as told by your health care provider.  If you were prescribed an antibiotic medicine, take it as told by your health care provider. Do not stop taking the antibiotic even if you start to feel better.  Talk with your health care provider before you take a cough suppressant medicine.  Drink enough fluid to keep your urine clear or pale yellow.  If the air is dry, use a cold steam vaporizer or humidifier in your bedroom or your home to help loosen secretions.  Avoid anything that causes you to cough at work or at home.  If your cough is worse at night, try sleeping in a semi-upright position.  Avoid cigarette smoke. If you smoke, quit smoking. If you need help quitting, ask your health care provider.  Avoid caffeine.  Avoid alcohol.  Rest as needed. SEEK MEDICAL CARE IF:   You have new symptoms.  You cough up pus.  Your cough does not get better after 2-3 weeks, or your cough gets worse.  You cannot control your cough with suppressant medicines and you are losing sleep.  You  develop pain that is getting worse or pain that is not controlled with pain medicines.  You have a fever.  You have unexplained weight loss.  You have night sweats. SEEK IMMEDIATE MEDICAL CARE IF:  You cough up blood.  You have difficulty breathing.  Your heartbeat is very fast.   This information is not intended to replace advice given to you by your health care provider. Make sure you discuss any questions you have with your health care provider.   Document Released: 04/24/2011 Document Revised: 07/17/2015 Document Reviewed: 01/02/2015 Elsevier Interactive Patient Education 2016 [place] Choices to Help Relieve Diarrhea, Adult When you have diarrhea, the foods you eat and your eating habits are very important. Choosing the right foods and drinks can help relieve diarrhea. Also, because diarrhea can last up to 7 days, you need to replace lost fluids and electrolytes (such as sodium, potassium, and chloride) in order to help prevent dehydration.  WHAT GENERAL GUIDELINES DO I NEED TO FOLLOW?  Slowly drink 1 cup (8 oz) of fluid for each episode of diarrhea. If you are getting enough fluid, your urine will be clear or pale yellow.  Eat starchy foods. Some good choices include white rice, white toast, pasta, low-fiber cereal, baked potatoes (without the skin), saltine crackers, and bagels.  Avoid large servings of any cooked vegetables.  Limit fruit to two servings per day. A serving is  cup or 1 small piece.  Choose foods with less than 2 g of  fiber per serving.  Limit fats to less than 8 tsp (38 g) per day.  Avoid fried foods.  Eat foods that have probiotics in them. Probiotics can be found in certain dairy products.  Avoid foods and beverages that may increase the speed at which food moves through the stomach and intestines (gastrointestinal tract). Things to avoid include:  High-fiber foods, such as dried fruit, raw fruits and vegetables, nuts, seeds, and whole  grain foods.  Spicy foods and high-fat foods.  Foods and beverages sweetened with high-fructose corn syrup, honey, or sugar alcohols such as xylitol, sorbitol, and mannitol. WHAT FOODS ARE RECOMMENDED? Grains White rice. White, [place], or pita breads (fresh or toasted), including plain rolls, buns, or bagels. White pasta. Saltine, soda, or graham crackers. Pretzels. Low-fiber cereal. Cooked cereals made with water (such as cornmeal, farina, or cream cereals). Plain muffins. Matzo. Melba toast. Zwieback.  Vegetables Potatoes (without the skin). Strained tomato and vegetable juices. Most well-cooked and canned vegetables without seeds. Tender lettuce. Fruits Cooked or canned applesauce, apricots, cherries, fruit cocktail, grapefruit, peaches, pears, or plums. Fresh bananas, apples without skin, cherries, grapes, cantaloupe, grapefruit, peaches, oranges, or plums.  Meat and Other Protein Products Baked or boiled chicken. Eggs. Tofu. Fish. Seafood. Smooth peanut butter. Ground or well-cooked tender beef, ham, veal, lamb, pork, or poultry.  Dairy Plain yogurt, kefir, and unsweetened liquid yogurt. Lactose-free milk, buttermilk, or soy milk. Plain hard cheese. Beverages Sport drinks. Clear broths. Diluted fruit juices (except prune). Regular, caffeine-free sodas such as ginger ale. Water. Decaffeinated teas. Oral rehydration solutions. Sugar-free beverages not sweetened with sugar alcohols. Other Bouillon, broth, or soups made from recommended foods.  The items listed above may not be a complete list of recommended foods or beverages. Contact your dietitian for more options. WHAT FOODS ARE NOT RECOMMENDED? Grains Whole grain, whole wheat, bran, or rye breads, rolls, pastas, crackers, and cereals. Wild or brown rice. Cereals that contain more than 2 g of fiber per serving. Corn tortillas or taco shells. Cooked or dry oatmeal. Granola. Popcorn. Vegetables Raw vegetables. Cabbage, broccoli, Brussels  sprouts, artichokes, baked beans, beet greens, corn, kale, legumes, peas, sweet potatoes, and yams. Potato skins. Cooked spinach and cabbage. Fruits Dried fruit, including raisins and dates. Raw fruits. Stewed or dried prunes. Fresh apples with skin, apricots, mangoes, pears, raspberries, and strawberries.  Meat and Other Protein Products Chunky peanut butter. Nuts and seeds. Beans and lentils. Berniece Salines.  Dairy High-fat cheeses. Milk, chocolate milk, and beverages made with milk, such as milk shakes. Cream. Ice cream. Sweets and Desserts Sweet rolls, doughnuts, and sweet breads. Pancakes and waffles. Fats and Oils Butter. Cream sauces. Margarine. Salad oils. Plain salad dressings. Olives. Avocados.  Beverages Caffeinated beverages (such as coffee, tea, soda, or energy drinks). Alcoholic beverages. Fruit juices with pulp. Prune juice. Soft drinks sweetened with high-fructose corn syrup or sugar alcohols. Other Coconut. Hot sauce. Chili powder. Mayonnaise. Gravy. Cream-based or milk-based soups.  The items listed above may not be a complete list of foods and beverages to avoid. Contact your dietitian for more information. WHAT SHOULD I DO IF I BECOME DEHYDRATED? Diarrhea can sometimes lead to dehydration. Signs of dehydration include dark urine and dry mouth and skin. If you think you are dehydrated, you should rehydrate with an oral rehydration solution. These solutions can be purchased at pharmacies, retail stores, or online.  Drink -1 cup (120-240 mL) of oral rehydration solution each time you have an episode of diarrhea. If drinking this amount makes  your diarrhea worse, try drinking smaller amounts more often. For example, drink 1-3 tsp (5-15 mL) every 5-10 minutes.  A general rule for staying hydrated is to drink 1-2 L of fluid per day. Talk to your health care provider about the specific amount you should be drinking each day. Drink enough fluids to keep your urine clear or pale yellow.     This information is not intended to replace advice given to you by your health care provider. Make sure you discuss any questions you have with your health care provider.   Document Released: 01/16/2004 Document Revised: 11/16/2014 Document Reviewed: 09/18/2013 Elsevier Interactive Patient Education 2016 Elsevier Inc[employer] Upper Respiratory Infection, Adult Most upper respiratory infections (URIs) are a viral infection of the air passages leading to the lungs. A URI affects the nose, throat, and upper air passages. The most common type of URI is nasopharyngitis and is typically referred to as "the common cold." URIs run their course and usually go away on their own. Most of the time, a URI does not require medical attention, but sometimes a bacterial infection in the upper airways can follow a viral infection. This is called a secondary infection. Sinus and middle ear infections are common types of secondary upper respiratory infections. Bacterial pneumonia can also complicate a URI. A URI can worsen asthma and chronic obstructive pulmonary disease (COPD). Sometimes, these complications can require emergency medical care and may be life threatening.  CAUSES Almost all URIs are caused by viruses. A virus is a type of germ and can spread from one person to another.  RISKS FACTORS You may be at risk for a URI if:   You smoke.   You have chronic heart or lung disease.  You have a weakened defense (immune) system.   You are very young or very old.   You have nasal allergies or asthma.  You work in crowded or poorly ventilated areas.  You work in health care facilities or schools. SIGNS AND SYMPTOMS  Symptoms typically develop 2-3 days after you come in contact with a cold virus. Most viral URIs last 7-10 days. However, viral URIs from the influenza virus (flu virus) can last 14-18 days and are typically more severe. Symptoms may include:   Runny or stuffy (congested) nose.   Sneezing.    Cough.   Sore throat.   Headache.   Fatigue.   Fever.   Loss of appetite.   Pain in your forehead, behind your eyes, and over your cheekbones (sinus pain).  Muscle aches.  DIAGNOSIS  Your health care provider may diagnose a URI by:  Physical exam.  Tests to check that your symptoms are not due to another condition such as:  Strep throat.  Sinusitis.  Pneumonia.  Asthma. TREATMENT  A URI goes away on its own with time. It cannot be cured with medicines, but medicines may be prescribed or recommended to relieve symptoms. Medicines may help:  Reduce your fever.  Reduce your cough.  Relieve nasal congestion. HOME CARE INSTRUCTIONS   Take medicines only as directed by your health care provider.   Gargle warm saltwater or take cough drops to comfort your throat as directed by your health care provider.  Use a warm mist humidifier or inhale steam from a shower to increase air moisture. This may make it easier to breathe.  Drink enough fluid to keep your urine clear or pale yellow.   Eat soups and other clear broths and maintain good nutrition.   Rest  as needed.   Return to work when your temperature has returned to normal or as your health care provider advises. You may need to stay home longer to avoid infecting others. You can also use a face mask and careful hand washing to prevent spread of the virus.  Increase the usage of your inhaler if you have asthma.   Do not use any tobacco products, including cigarettes, chewing tobacco, or electronic cigarettes. If you need help quitting, ask your health care provider. PREVENTION  The best way to protect yourself from getting a cold is to practice good hygiene.   Avoid oral or hand contact with people with cold symptoms.   Wash your hands often if contact occurs.  There is no clear evidence that vitamin C, vitamin E, echinacea, or exercise reduces the chance of developing a cold. However, it is  always recommended to get plenty of rest, exercise, and practice good nutrition.  SEEK MEDICAL CARE IF:   You are getting worse rather than better.   Your symptoms are not controlled by medicine.   You have chills.  You have worsening shortness of breath.  You have brown or red mucus.  You have yellow or brown nasal discharge.  You have pain in your face, especially when you bend forward.  You have a fever.  You have swollen neck glands.  You have pain while swallowing.  You have white areas in the back of your throat. SEEK IMMEDIATE MEDICAL CARE IF:   You have severe or persistent:  Headache.  Ear pain.  Sinus pain.  Chest pain.  You have chronic lung disease and any of the following:  Wheezing.  Prolonged cough.  Coughing up blood.  A change in your usual mucus.  You have a stiff neck.  You have changes in your:  Vision.  Hearing.  Thinking.  Mood. MAKE SURE YOU:   Understand these instructions.  Will watch your condition.  Will get help right away if you are not doing well or get worse.   This information is not intended to replace advice given to you by your health care provider. Make sure you discuss any questions you have with your health care provider.   Document Released: 04/21/2001 Document Revised: 03/12/2015 Document Reviewed: 01/31/2014 Elsevier Interactive Patient Education [employer].

## 2016-02-06 NOTE — ED Provider Notes (Signed)
CSN: [id number]     Arrival date & time 02/06/16  1920 History   First MD Initiated Contact with Patient 02/06/16 2059     Chief Complaint  Patient presents with  . Cough  . Generalized Body Aches  . Chills  . Fever  . Nasal Congestion     (Consider location/radiation/quality/duration/timing/severity/associated sxs/prior Treatment) HPI Comments: Patient with a history of HTN presents with 24 hour history of cough, fever, body aches, congestion and diarrhea. No nausea or vomiting. Per patient, mother had similar symptoms over the last several days, now the patient and her daughter have the same illness.   Patient is a 35 y.o. female presenting with cough and fever. The history is provided by the patient. No language interpreter was used.  Cough Associated symptoms: fever and myalgias   Associated symptoms: no rash and no shortness of breath   Fever Associated symptoms: congestion, cough, diarrhea and myalgias   Associated symptoms: no nausea, no rash and no vomiting     Past Medical History  Diagnosis Date  . Abnormal Pap smear 2011    ASCUS  . Hypertension   . Vaginal Pap smear, abnormal   . Hx of bilateral breast reduction surgery 2007   Past Surgical History  Procedure Laterality Date  . Breast surgery      reduction  . Wisdom tooth extraction    . Therapeutic abortion  2000  . Dilation and curettage of uterus      with polypectomy   Family History  Problem Relation Age of Onset  . Hypertension Mother   . Drug abuse Mother     recovering addict  . Hypertension Maternal Aunt   . Diabetes Paternal Aunt   . Hypertension Maternal Grandmother   . Diabetes Paternal Grandmother    Social History  Substance Use Topics  . Smoking status: Former Research scientist (life sciences)  . Smokeless tobacco: None  . Alcohol Use: No   OB History    Gravida Para Term Preterm AB TAB SAB Ectopic Multiple Living   4 2 2  2 1 1   0 2     Review of Systems  Constitutional: Positive for fever.  HENT:  Positive for congestion. Negative for trouble swallowing.   Respiratory: Positive for cough. Negative for shortness of breath.   Gastrointestinal: Positive for diarrhea. Negative for nausea and vomiting.  Musculoskeletal: Positive for myalgias.  Skin: Negative for rash.      Allergies  Benadryl  Home Medications   Prior to Admission medications   Medication Sig Start Date End Date Taking? Authorizing Provider  ibuprofen (ADVIL,MOTRIN) 600 MG tablet Take 1 tablet (600 mg total) by mouth every 6 (six) hours. 12/11/15  Yes Juanda Chance, NP  labetalol (NORMODYNE) 100 MG tablet Take 1 tablet (100 mg total) by mouth 2 (two) times daily. 12/11/15  Yes Juanda Chance, NP   BP 150/98 mmHg  Pulse 89  Temp(Src) 102.8 F (39.3 C) (Oral)  Resp 18  SpO2 100%  LMP 01/23/2016 Physical Exam  Constitutional: She is oriented to person, place, and time. She appears well-developed and well-nourished.  HENT:  Head: Normocephalic.  Mouth/Throat: Oropharynx is clear and moist.  Eyes: Conjunctivae are normal.  Neck: Normal range of motion. Neck supple.  Cardiovascular: Normal rate and regular rhythm.   No murmur heard. Pulmonary/Chest: Effort normal and breath sounds normal. She has no wheezes. She has no rales.  Abdominal: Soft. Bowel sounds are normal. There is no tenderness. There is no rebound and no guarding.  Musculoskeletal: Normal range of motion.  Neurological: She is alert and oriented to person, place, and time.  Skin: Skin is warm and dry. No rash noted.  Psychiatric: She has a normal mood and affect.    ED Course  Procedures (including critical care time) Labs Review Labs Reviewed  CBC WITH DIFFERENTIAL/PLATELET - Abnormal; Notable for the following:    Hemoglobin 11.4 (*)    MCH 24.4 (*)    RDW 15.8 (*)    All other components within normal limits  BASIC METABOLIC PANEL - Abnormal; Notable for the following:    Potassium 2.9 (*)    BUN <5 (*)    Calcium 8.8 (*)    All other  components within normal limits  URINALYSIS, ROUTINE W REFLEX MICROSCOPIC (NOT AT [facility])  POC URINE PREG, ED   Results for orders placed or performed during the hospital encounter of 02/06/16  CBC with Differential  Result Value Ref Range   WBC 6.0 4.0 - 10.5 K/uL   RBC 4.68 3.87 - 5.11 MIL/uL   Hemoglobin 11.4 (L) 12.0 - 15.0 g/dL   HCT 36.7 36.0 - 46.0 %   MCV 78.4 78.0 - 100.0 fL   MCH 24.4 (L) 26.0 - 34.0 pg   MCHC 31.1 30.0 - 36.0 g/dL   RDW 15.8 (H) 11.5 - 15.5 %   Platelets 286 150 - 400 K/uL   Neutrophils Relative % 78 %   Neutro Abs 4.6 1.7 - 7.7 K/uL   Lymphocytes Relative 11 %   Lymphs Abs 0.7 0.7 - 4.0 K/uL   Monocytes Relative 10 %   Monocytes Absolute 0.6 0.1 - 1.0 K/uL   Eosinophils Relative 1 %   Eosinophils Absolute 0.1 0.0 - 0.7 K/uL   Basophils Relative 0 %   Basophils Absolute 0.0 0.0 - 0.1 K/uL  Basic metabolic panel  Result Value Ref Range   Sodium 140 135 - 145 mmol/L   Potassium 2.9 (L) 3.5 - 5.1 mmol/L   Chloride 105 101 - 111 mmol/L   CO2 28 22 - 32 mmol/L   Glucose, Bld 87 65 - 99 mg/dL   BUN <5 (L) 6 - 20 mg/dL   Creatinine, Ser 0.89 0.44 - 1.00 mg/dL   Calcium 8.8 (L) 8.9 - 10.3 mg/dL   GFR calc non Af Amer >60 >60 mL/min   GFR calc Af Amer >60 >60 mL/min   Anion gap 7 5 - 15    Imaging Review Dg Chest 2 View  02/06/2016  CLINICAL DATA:  Fever and cough for 1 day. EXAM: CHEST  2 VIEW COMPARISON:  11/09/2008 FINDINGS: The heart size and mediastinal contours are within normal limits. Both lungs are clear. No pleural effusion or pneumothorax. The visualized skeletal structures are unremarkable. IMPRESSION: No active cardiopulmonary disease. Electronically Signed   By: Lajean Manes M.D.   On: 02/06/2016 20:50   I have personally reviewed and evaluated these images and lab results as part of my medical decision-making.   EKG Interpretation None      MDM   Final diagnoses:  Productive cough  Fever    1. Febrile respiratory illness 2.  Hypokalemia   The patient is well appearing, actively coughing. No evidence or concern for sepsis. CXR, lab studies unremarkable with exception of mild hypokalemia. Will supplement. Encourage supportive care and PCP follow up.     Charlann Lange, PA-C 02/06/16 [place], MD 02/07/16 [phone]

## 2016-02-18 ENCOUNTER — Ambulatory Visit: Payer: Self-pay | Admitting: General Surgery

## 2016-02-18 NOTE — H&P (Signed)
Kenni C. Freemont 02/18/2016 4:36 PM Location: [facility] Patient #: [id number] DOB: [birth year] Single / Language: Michelle Duarte / Race: Black or African American Female  History of Present Illness Odis Hollingshead MD; 02/18/2016 5:47 PM) The patient is a 35 year old female.   Note:She is 10 weeks post delivery of her son. She is sent over here by Dr. 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  Of note is that she just had a laparoscopic tubal ligation about 3 weeks ago. She is noted a small piece of the suture protruding from her subumbilical incision. She is going back for a wound check next week.  Allergies Marjean Donna, CMA; 02/18/2016 4:37 PM) No Known Drug Allergies 12/19/2014  Medication History (Sonya Bynum, CMA; 02/18/2016 4:37 PM) Amlodipine-Atorvastatin (5-10MG  Tablet, Oral daily) Active. Medications Reconciled    Vitals (Sonya Bynum CMA; 02/18/2016 4:37 PM) 02/18/2016 4:36 PM Weight: 240 lb Height: 63in Body Surface Area: 2.09 m Body Mass Index: 42.51 kg/m  Pulse: 81 (Regular)  BP: 132/74 (Sitting, Left Arm, Standard)      Physical Exam Odis Hollingshead MD; 02/18/2016 5:49 PM)  The physical exam findings are as follows: Note:General-obese female no acute distress.  Musculoskeletal-in the medial aspect of the upper arm, there is a 3 cm mobile subcutaneous soft tissue mass present that is tender. No redness. In the pilonidal area, there is a scab just to the right of midline superiorly. Multiple small pits in the midline are noted.  Abdomen-small subumbilical incision with a small amount of white suture protruding from it.    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  Plan: We discussed extensive pilonidal cystectomy and she would like to proceed  with this. The procedure and risks were discussed with her. Risks include but are not limited to bleeding, recurrent infection, wound problems, anesthesia.  Current Plans Follow up as needed Schedule for Surgery Pt Education - [facility] Free Text Education/Instructions: discussed with patient and provided information. MASS OF SOFT TISSUE OF RIGHT UPPER EXTREMITY (R22.31) Impression: This is been present for some time but now it starting to hurt her. She is interested in having it removed.  Plan: We will remove this at the same time we do the pilonidal cystectomy. We discussed the procedure and risks of this as well including but not limited to bleeding, infection, wound healing problems, nerve injury, recurrence.  Jackolyn Confer, MD

## 2016-04-14 ENCOUNTER — Encounter (HOSPITAL_COMMUNITY)
Admission: RE | Admit: 2016-04-14 | Discharge: 2016-04-14 | Disposition: A | Discharge status: Home / Self Care | Payer: 59 | Source: Ambulatory Visit | Attending: General Surgery | Admitting: General Surgery

## 2016-04-14 ENCOUNTER — Encounter (HOSPITAL_COMMUNITY): Payer: Self-pay

## 2016-04-14 DIAGNOSIS — I10 Essential (primary) hypertension: Secondary | ICD-10-CM | POA: Diagnosis not present

## 2016-04-14 DIAGNOSIS — Z01812 Encounter for preprocedural laboratory examination: Secondary | ICD-10-CM | POA: Insufficient documentation

## 2016-04-14 DIAGNOSIS — Z01818 Encounter for other preprocedural examination: Secondary | ICD-10-CM | POA: Insufficient documentation

## 2016-04-14 DIAGNOSIS — Z79899 Other long term (current) drug therapy: Secondary | ICD-10-CM | POA: Diagnosis not present

## 2016-04-14 DIAGNOSIS — L0591 Pilonidal cyst without abscess: Secondary | ICD-10-CM | POA: Insufficient documentation

## 2016-04-14 DIAGNOSIS — F17200 Nicotine dependence, unspecified, uncomplicated: Secondary | ICD-10-CM | POA: Diagnosis not present

## 2016-04-14 HISTORY — DX: Adverse effect of unspecified anesthetic, initial encounter: T41.45XA

## 2016-04-14 HISTORY — DX: Depression, unspecified: F32.A

## 2016-04-14 HISTORY — DX: Other specified postprocedural states: R11.2

## 2016-04-14 HISTORY — DX: Other specified postprocedural states: Z98.890

## 2016-04-14 HISTORY — DX: Other complications of anesthesia, initial encounter: T88.59XA

## 2016-04-14 HISTORY — DX: Major depressive disorder, single episode, unspecified: F32.9

## 2016-04-14 LAB — BASIC METABOLIC PANEL
Anion gap: 4 — ABNORMAL LOW (ref 5–15)
BUN: 8 mg/dL (ref 6–20)
CALCIUM: 8.6 mg/dL — AB (ref 8.9–10.3)
CO2: 29 mmol/L (ref 22–32)
CREATININE: 0.82 mg/dL (ref 0.44–1.00)
Chloride: 105 mmol/L (ref 101–111)
GFR calc Af Amer: >60 mL/min (ref >60)
GLUCOSE: 86 mg/dL (ref 65–99)
Potassium: 2.9 mmol/L — ABNORMAL LOW (ref 3.5–5.1)
Sodium: 138 mmol/L (ref 135–145)

## 2016-04-14 LAB — CBC
HCT: 36.9 % (ref 36.0–46.0)
Hemoglobin: 11.5 g/dL — ABNORMAL LOW (ref 12.0–15.0)
MCH: 24.4 pg — AB (ref 26.0–34.0)
MCHC: 31.2 g/dL (ref 30.0–36.0)
MCV: 78.2 fL (ref 78.0–100.0)
Platelets: 276 10*3/uL (ref 150–400)
RBC: 4.72 MIL/uL (ref 3.87–5.11)
RDW: 15.9 % — AB (ref 11.5–15.5)
WBC: 7.5 10*3/uL (ref 4.0–10.5)

## 2016-04-14 LAB — HCG, SERUM, QUALITATIVE: PREG SERUM: NEGATIVE

## 2016-04-14 NOTE — Pre-Procedure Instructions (Signed)
    Ellin Goodie  Your procedure is scheduled on Monday, June 12.  Report to [facility] Admitting at 5:30 A.M.               For any other questions, please call [phone], Monday - Friday 8 AM - 4 PM.   Call this number if you have problems the morning of surgery:[phone]   Remember:  Do not eat food or drink liquids after midnight Sunday, June 11.  Take these medicines the morning of surgery with A SIP OF WATER : amLODipine (NORVASC)                  Stop taking Aspirin, Coumadin, Plavix, Effient and Herbal medications.  Do not take any NSAIDs ie: Ibuprofen,  Advil,Naproxen or any medication containing Aspirin.   Do not wear jewelry, make-up or nail polish.  Do not wear lotions, powders, or perfumes.    Do not shave 48 hours prior to surgery.    Do not bring valuables to the hospital.  [facility] is not responsible for any belongings or valuables.  Contacts, dentures or bridgework may not be worn into surgery.  Leave your suitcase in the car.  After surgery it may be brought to your room.  Special instructions: Review  [facility] - Preparing For Surgery.

## 2016-04-15 ENCOUNTER — Other Ambulatory Visit: Payer: Self-pay | Admitting: General Surgery

## 2016-04-15 NOTE — Progress Notes (Signed)
Anesthesia Chart Review:  Pt is a 35 year old female scheduled for pilonidal cystectomy, removal of R arm mass on 04/20/2016 with Dr. Zella Richer.   PMH includes:  HTN, post-op N/V. Current smoker. BMI 44  Medications include: amlodipine  Preoperative labs reviewed.  K 2.9. Dr. Bertrum Sol office notified. Will recheck DOS.   Chest x-ray 02/06/16 reviewed. No active cardiopulmonary disease.   EKG 04/14/16: Sinus bradycardia (52 bpm). Left axis deviation. Minimal voltage criteria for LVH, may be normal variant. Nonspecific T wave abnormality  If labs acceptable DOS, I anticipate pt can proceed as scheduled.   Willeen Cass, FNP-BC [facility]/Anesthesiology Phone: [phone] 04/15/2016 1:16 PM

## 2016-05-14 ENCOUNTER — Encounter (HOSPITAL_COMMUNITY): Payer: Self-pay | Admitting: *Deleted

## 2016-05-14 MED ORDER — CEFAZOLIN SODIUM-DEXTROSE 2-4 GM/100ML-% IV SOLN: 2.0000 g | INTRAVENOUS | Status: DC

## 2016-05-14 NOTE — Progress Notes (Signed)
Called pt for pre-op call. When I asked pt what the procedure she was having done tomorrow was, she answered "removal of a cyst on my arm". We have that she is having a pilonidal cystectomy. She states that she is NOT having that surgery and she states she told Dr. Zella Richer that she wasn't having it done. We finished the call and I called Dr. Bertrum Sol office and spoke with Raquel Sarna. She spoke with Dr. Zella Richer and she states he told her that he would discuss this with the patient in the morning. That he was not changing anything in the orders at this time since it would be "against medical advice".

## 2016-05-15 ENCOUNTER — Ambulatory Visit (HOSPITAL_COMMUNITY): Payer: 59 | Admitting: Emergency Medicine

## 2016-05-15 ENCOUNTER — Encounter (HOSPITAL_COMMUNITY)
Admission: RE | Disposition: A | Discharge status: Home / Self Care | Payer: Self-pay | Source: Ambulatory Visit | Attending: General Surgery

## 2016-05-15 ENCOUNTER — Ambulatory Visit (HOSPITAL_COMMUNITY)
Admission: RE | Admit: 2016-05-15 | Discharge: 2016-05-15 | Disposition: A | Discharge status: Home / Self Care | Payer: 59 | Source: Ambulatory Visit | Attending: General Surgery | Admitting: General Surgery

## 2016-05-15 ENCOUNTER — Encounter (HOSPITAL_COMMUNITY): Payer: Self-pay | Admitting: Surgery

## 2016-05-15 DIAGNOSIS — E669 Obesity, unspecified: Secondary | ICD-10-CM | POA: Insufficient documentation

## 2016-05-15 DIAGNOSIS — L0591 Pilonidal cyst without abscess: Secondary | ICD-10-CM | POA: Diagnosis not present

## 2016-05-15 DIAGNOSIS — D3612 Benign neoplasm of peripheral nerves and autonomic nervous system, upper limb, including shoulder: Secondary | ICD-10-CM | POA: Diagnosis not present

## 2016-05-15 DIAGNOSIS — Z79899 Other long term (current) drug therapy: Secondary | ICD-10-CM | POA: Insufficient documentation

## 2016-05-15 DIAGNOSIS — Z6841 Body Mass Index (BMI) 40.0 and over, adult: Secondary | ICD-10-CM | POA: Insufficient documentation

## 2016-05-15 DIAGNOSIS — E876 Hypokalemia: Secondary | ICD-10-CM | POA: Insufficient documentation

## 2016-05-15 DIAGNOSIS — R2231 Localized swelling, mass and lump, right upper limb: Secondary | ICD-10-CM | POA: Diagnosis present

## 2016-05-15 HISTORY — PX: MASS EXCISION: SHX2000

## 2016-05-15 LAB — CBC
HEMATOCRIT: 34.1 % — AB (ref 36.0–46.0)
Hemoglobin: 11.1 g/dL — ABNORMAL LOW (ref 12.0–15.0)
MCH: 25.3 pg — AB (ref 26.0–34.0)
MCHC: 32.6 g/dL (ref 30.0–36.0)
MCV: 77.9 fL — AB (ref 78.0–100.0)
Platelets: 262 10*3/uL (ref 150–400)
RBC: 4.38 MIL/uL (ref 3.87–5.11)
RDW: 16.1 % — AB (ref 11.5–15.5)
WBC: 8 10*3/uL (ref 4.0–10.5)

## 2016-05-15 LAB — BASIC METABOLIC PANEL
Anion gap: 5 (ref 5–15)
BUN: 9 mg/dL (ref 6–20)
CHLORIDE: 106 mmol/L (ref 101–111)
CO2: 29 mmol/L (ref 22–32)
CREATININE: 0.73 mg/dL (ref 0.44–1.00)
Calcium: 8.5 mg/dL — ABNORMAL LOW (ref 8.9–10.3)
GFR calc non Af Amer: >60 mL/min (ref >60)
GLUCOSE: 90 mg/dL (ref 65–99)
Potassium: 2.7 mmol/L — CL (ref 3.5–5.1)
SODIUM: 140 mmol/L (ref 135–145)

## 2016-05-15 LAB — HCG, SERUM, QUALITATIVE: PREG SERUM: NEGATIVE

## 2016-05-15 SURGERY — EXCISION MASS
Anesthesia: General | Laterality: Right

## 2016-05-15 MED ORDER — TRAMADOL HCL 50 MG PO TABS: 50.0000 mg | ORAL_TABLET | Freq: Four times a day (QID) | ORAL | Status: DC | PRN

## 2016-05-15 MED ORDER — FENTANYL CITRATE (PF) 250 MCG/5ML IJ SOLN
INTRAMUSCULAR | Status: AC
  Filled 2016-05-15: qty 5

## 2016-05-15 MED ORDER — BUPIVACAINE-EPINEPHRINE (PF) 0.25% -1:200000 IJ SOLN
INTRAMUSCULAR | Status: AC
  Filled 2016-05-15: qty 30

## 2016-05-15 MED ORDER — MIDAZOLAM HCL 2 MG/2ML IJ SOLN
INTRAMUSCULAR | Status: AC
Start: 2016-05-15 — End: 2016-05-15
  Filled 2016-05-15: qty 2

## 2016-05-15 MED ORDER — POTASSIUM CHLORIDE ER 10 MEQ PO TBCR: 40.0000 meq | EXTENDED_RELEASE_TABLET | Freq: Every day | ORAL | Status: DC

## 2016-05-15 MED ORDER — PROPOFOL 10 MG/ML IV BOLUS
INTRAVENOUS | Status: AC
  Filled 2016-05-15: qty 40

## 2016-05-15 MED ORDER — LIDOCAINE HCL (PF) 1 % IJ SOLN
INTRAMUSCULAR | Status: DC | PRN
Start: 2016-05-15 — End: 2016-05-15
  Administered 2016-05-15: 4 mL

## 2016-05-15 SURGICAL SUPPLY — 48 items
BENZOIN TINCTURE PRP APPL 2/3 (GAUZE/BANDAGES/DRESSINGS) ×3 IMPLANT
BLADE SURG ROTATE 9660 (MISCELLANEOUS) IMPLANT
CANISTER SUCTION 2500CC (MISCELLANEOUS) IMPLANT
CHLORAPREP W/TINT 26ML (MISCELLANEOUS) ×3 IMPLANT
CLEANER TIP ELECTROSURG 2X2 (MISCELLANEOUS) IMPLANT
COVER SURGICAL LIGHT HANDLE (MISCELLANEOUS) IMPLANT
DRAPE LAPAROTOMY T 102X78X121 (DRAPES) IMPLANT
DRAPE LAPAROTOMY T 98X78 PEDS (DRAPES) IMPLANT
DRAPE ORTHO SPLIT 77X108 STRL (DRAPES)
DRAPE SURG ORHT 6 SPLT 77X108 (DRAPES) IMPLANT
DRAPE UTILITY XL STRL (DRAPES) ×6 IMPLANT
ELECT CAUTERY BLADE 6.4 (BLADE) IMPLANT
ELECT REM PT RETURN 9FT ADLT (ELECTROSURGICAL)
ELECTRODE REM PT RTRN 9FT ADLT (ELECTROSURGICAL) IMPLANT
GAUZE SPONGE 4X4 12PLY STRL (GAUZE/BANDAGES/DRESSINGS) ×3 IMPLANT
GLOVE BIOGEL PI IND STRL 8 (GLOVE) IMPLANT
GLOVE BIOGEL PI INDICATOR 8 (GLOVE)
GLOVE ECLIPSE 8.0 STRL XLNG CF (GLOVE) ×3 IMPLANT
GOWN STRL REUS W/ TWL LRG LVL3 (GOWN DISPOSABLE) IMPLANT
GOWN STRL REUS W/TWL LRG LVL3 (GOWN DISPOSABLE)
KIT BASIN OR (CUSTOM PROCEDURE TRAY) IMPLANT
KIT ROOM TURNOVER OR (KITS) IMPLANT
LIQUID BAND (GAUZE/BANDAGES/DRESSINGS) IMPLANT
NEEDLE 22X1 1/2 (OR ONLY) (NEEDLE) IMPLANT
NS IRRIG 1000ML POUR BTL (IV SOLUTION) IMPLANT
PACK SURGICAL SETUP 50X90 (CUSTOM PROCEDURE TRAY) IMPLANT
PAD ARMBOARD 7.5X6 YLW CONV (MISCELLANEOUS) IMPLANT
PENCIL BUTTON HOLSTER BLD 10FT (ELECTRODE) IMPLANT
SPECIMEN JAR SMALL (MISCELLANEOUS) ×3 IMPLANT
SPONGE LAP 18X18 X RAY DECT (DISPOSABLE) IMPLANT
SPONGE LAP 4X18 X RAY DECT (DISPOSABLE) IMPLANT
STRIP CLOSURE SKIN 1/2X4 (GAUZE/BANDAGES/DRESSINGS) IMPLANT
SUT ETHILON 3 0 FSL (SUTURE) ×3 IMPLANT
SUT ETHILON 4 0 PS 2 18 (SUTURE) IMPLANT
SUT MNCRL AB 3-0 PS2 18 (SUTURE) IMPLANT
SUT MON AB 4-0 PC3 18 (SUTURE) IMPLANT
SUT VIC AB 2-0 SH 27 (SUTURE)
SUT VIC AB 2-0 SH 27X BRD (SUTURE) IMPLANT
SUT VIC AB 3-0 SH 27 (SUTURE)
SUT VIC AB 3-0 SH 27XBRD (SUTURE) IMPLANT
SWAB COLLECTION DEVICE MRSA (MISCELLANEOUS) IMPLANT
TOWEL OR 17X24 6PK STRL BLUE (TOWEL DISPOSABLE) IMPLANT
TOWEL OR 17X26 10 PK STRL BLUE (TOWEL DISPOSABLE) ×3 IMPLANT
TUBE ANAEROBIC SPECIMEN COL (MISCELLANEOUS) IMPLANT
TUBE CONNECTING 12X1/4 (SUCTIONS) IMPLANT
UNDERPAD 30X30 INCONTINENT (UNDERPADS AND DIAPERS) IMPLANT
WATER STERILE IRR 1000ML POUR (IV SOLUTION) IMPLANT
YANKAUER SUCT BULB TIP NO VENT (SUCTIONS) IMPLANT

## 2016-05-15 NOTE — H&P (Signed)
She has a chronically draining pilonidal cyst and decided yesterday that she did not want to have the pilonidal cystectomy part of her surgery.  She does want to have the subcutaneous mass removed in her right arm.  Of note is her potassium is 2.7  PE  Gen-obese female in NAD  RUE-3.5 cm subcutaneous mass medial right upper arm  Assess:  Enlarging [place] mass RUE.  Hypokalemia.  Plan:  Removal of mass under local anesthesia.  Will give her a prescription for KCL tabs and have her follow up with her PCP regarding her hypokalemia.

## 2016-05-15 NOTE — Progress Notes (Signed)
Patient discharged to home. All patient belongings taken with patient. 

## 2016-05-15 NOTE — Progress Notes (Addendum)
OR called and states that if pt is not having pilonidal cyst removed (pt refuses) not to start IV that the removal of arm mass can be done under local per Dr Zella Richer. CRNA informed.

## 2016-05-15 NOTE — Progress Notes (Signed)
Lab personnel called and informed  Nurse that patients potassium level was 2.7. Dr. Ermalene Postin was at patients bedside and was informed of this, and De. Moser informed Nurse to notify Dr. Zella Richer of this. Nurse then paged Dr. Zella Richer. Awaiting page.

## 2016-05-15 NOTE — Op Note (Signed)
Operative Note  Michelle Duarte female 35 y.o. 05/15/2016  PREOPERATIVE DX:  Subcutaneous mass right upper arm  POSTOPERATIVE DX:  Same  PROCEDURE:   Excision of 3 cm subcutaneous mass          Surgeon: Odis Hollingshead   Anesthesia: Local anesthesia 1% buffered lidocaine  Indications:   This is a 35 year old female who has an She was scheduled to have a pilonidal cy She was decided not to have the p going to have the right upper arm . Of note is that she is hypokalemic with K+ = 2.7.    Procedure Detail:  She was seenea in the rigass marked wih my initials This area was then sterilely  Prepped and draped. Xylocaine was infiltrated in the skin over the mass.  A longitudinal incision was made through the skin.  Superficial subcutaneous tissue was separated bluntly and the mass was visualized.  Using blunt dissection, a firm encapsulated mass was removed which measured 3 cm.  It was sent to pathology.  Bleeding was controlled with direct pressure.  Once hemostasis was adequate, the subcutaneous tissue was approximated with interrupted 3-0 Vicryl sutures.  The skin was closed with a running 4-0 Monocryl subcuticular stitch.  Steri strips and a sterile dressing were applied.  She tolerated the procedure well without any apparent complications and was discharged in good condition.  Discharge instructions were given to her.   Estimated Blood Loss:  Minimal          Specimens: Mass to path

## 2016-05-15 NOTE — Progress Notes (Signed)
Dr. Zella Richer returned page and Nurse informed him that patients potassium was 2.7. Dr. Zella Richer informed Nurse that he was coming to see patient and that a prescription for potassium would be given to patient after surgery.

## 2016-05-15 NOTE — Discharge Instructions (Signed)
You may shower.  Remove bandage Monday.  Call for heavy bleeding, signs of infection, or other wound problems.  May office will call you to make a follow up appointment.  Take potassium pills as directed and have your potassium level checked by your PCP on Monday.  Potassium rich foods = bananas, squash, yogurt, potatoes, white beans.

## 2016-05-15 NOTE — Interval H&P Note (Signed)
History and Physical Interval Note:  05/15/2016 7:30 AM  Michelle Duarte  has presented today for surgery, with the diagnosis of Chronically infected pilonidal cyst /RIGHT ARM MASS  The various methods of treatment have been discussed with the patient and family. After consideration of risks, benefits and other options for treatment, the patient has consented to  Procedure(s):  PILONIDAL CYSTECTOMY  (N/A) REMOVAL OF RIGHT ARM MASS (Right) as a surgical intervention .  The patient's history has been reviewed, patient examined, no change in status, stable for surgery.  I have reviewed the patient's chart and labs.  Questions were answered to the patient's satisfaction.     Rickelle Sylvestre Lenna Sciara

## 2016-05-15 NOTE — Progress Notes (Signed)
Time out done, and minor room procedure started by Dr. Zella Richer. Cyst removed from right arm and placed in a sterile specimen container, and taken to lab by Maryjean Ka, RN. Pathology specimen number was [id number]. Patient tolerated procedure well.

## 2016-05-15 NOTE — Progress Notes (Signed)
Patient is dressed and awaiting discharge. Patient stated Dr. Zella Richer informed her that his office staff would contact her for a follow up appointment, and that she will call her PCP and have her potassium levels rechecked on Monday.  Nurse reviewed prescription for Tramadol and Potassium with patient. Patient verbalized understanding. Patients fiance Quillian Quince also at bedside. All questions answered.

## 2016-05-18 ENCOUNTER — Encounter (HOSPITAL_COMMUNITY): Payer: Self-pay | Admitting: General Surgery

## 2016-06-08 ENCOUNTER — Other Ambulatory Visit: Payer: Self-pay | Admitting: Internal Medicine

## 2016-06-08 DIAGNOSIS — I10 Essential (primary) hypertension: Secondary | ICD-10-CM

## 2016-06-17 ENCOUNTER — Ambulatory Visit
Admission: RE | Admit: 2016-06-17 | Discharge: 2016-06-17 | Disposition: A | Discharge status: Home / Self Care | Payer: 59 | Source: Ambulatory Visit | Attending: Internal Medicine | Admitting: Internal Medicine

## 2016-06-17 ENCOUNTER — Other Ambulatory Visit: Payer: 59

## 2016-06-17 DIAGNOSIS — I10 Essential (primary) hypertension: Secondary | ICD-10-CM

## 2016-06-26 ENCOUNTER — Encounter (HOSPITAL_COMMUNITY): Payer: Self-pay | Admitting: *Deleted

## 2016-06-26 ENCOUNTER — Inpatient Hospital Stay (HOSPITAL_COMMUNITY)
Admission: AD | Admit: 2016-06-26 | Discharge: 2016-06-26 | Disposition: A | Discharge status: Home / Self Care | Payer: 59 | Source: Ambulatory Visit | Attending: Obstetrics & Gynecology | Admitting: Obstetrics & Gynecology

## 2016-06-26 DIAGNOSIS — F329 Major depressive disorder, single episode, unspecified: Secondary | ICD-10-CM | POA: Insufficient documentation

## 2016-06-26 DIAGNOSIS — M546 Pain in thoracic spine: Secondary | ICD-10-CM | POA: Insufficient documentation

## 2016-06-26 DIAGNOSIS — M549 Dorsalgia, unspecified: Secondary | ICD-10-CM | POA: Diagnosis present

## 2016-06-26 DIAGNOSIS — Z888 Allergy status to other drugs, medicaments and biological substances status: Secondary | ICD-10-CM | POA: Insufficient documentation

## 2016-06-26 DIAGNOSIS — Z9889 Other specified postprocedural states: Secondary | ICD-10-CM | POA: Diagnosis not present

## 2016-06-26 DIAGNOSIS — Z79899 Other long term (current) drug therapy: Secondary | ICD-10-CM | POA: Insufficient documentation

## 2016-06-26 DIAGNOSIS — F17290 Nicotine dependence, other tobacco product, uncomplicated: Secondary | ICD-10-CM | POA: Insufficient documentation

## 2016-06-26 DIAGNOSIS — I10 Essential (primary) hypertension: Secondary | ICD-10-CM | POA: Insufficient documentation

## 2016-06-26 LAB — URINE MICROSCOPIC-ADD ON

## 2016-06-26 LAB — URINALYSIS, ROUTINE W REFLEX MICROSCOPIC
Bilirubin Urine: NEGATIVE
GLUCOSE, UA: NEGATIVE mg/dL
Ketones, ur: NEGATIVE mg/dL
Nitrite: NEGATIVE
PH: 8 (ref 5.0–8.0)
PROTEIN: NEGATIVE mg/dL
SPECIFIC GRAVITY, URINE: 1.01 (ref 1.005–1.030)

## 2016-06-26 LAB — CBC
HCT: 35 % — ABNORMAL LOW (ref 36.0–46.0)
Hemoglobin: 11.8 g/dL — ABNORMAL LOW (ref 12.0–15.0)
MCH: 25 pg — AB (ref 26.0–34.0)
MCHC: 33.7 g/dL (ref 30.0–36.0)
MCV: 74.2 fL — ABNORMAL LOW (ref 78.0–100.0)
PLATELETS: 311 10*3/uL (ref 150–400)
RBC: 4.72 MIL/uL (ref 3.87–5.11)
RDW: 16.6 % — ABNORMAL HIGH (ref 11.5–15.5)
WBC: 11.3 10*3/uL — ABNORMAL HIGH (ref 4.0–10.5)

## 2016-06-26 LAB — POCT PREGNANCY, URINE: Preg Test, Ur: NEGATIVE

## 2016-06-26 MED ORDER — CYCLOBENZAPRINE HCL 10 MG PO TABS: 10.0000 mg | ORAL_TABLET | Freq: Two times a day (BID) | ORAL | 0 refills | Status: DC | PRN

## 2016-06-26 MED ORDER — KETOROLAC TROMETHAMINE 60 MG/2ML IM SOLN
60.0000 mg | Freq: Once | INTRAMUSCULAR | Status: AC
  Administered 2016-06-26: 60 mg via INTRAMUSCULAR
  Filled 2016-06-26: qty 2

## 2016-06-26 MED ORDER — PROMETHAZINE HCL 25 MG/ML IJ SOLN
25.0000 mg | Freq: Once | INTRAMUSCULAR | Status: AC
  Administered 2016-06-26: 25 mg via INTRAMUSCULAR
  Filled 2016-06-26: qty 1

## 2016-06-26 NOTE — MAU Note (Signed)
Pt reports she started her period yesterday and was having cramping. Nausea, vomiting, and a headache all day yesterday. Last pm she started having severe mid and lower right sided pain.

## 2016-06-26 NOTE — Discharge Instructions (Signed)

## 2016-06-26 NOTE — MAU Note (Signed)
10am started vomiting, then started having right sided flank pain. By noon, she vomited again. By 3pm, vomited again. At 11pm, pt started feeling bad again and the pain became very sharp and severe.

## 2016-06-26 NOTE — MAU Provider Note (Signed)
History     CSN: [id number]  Arrival date and time: 06/26/16 0045   First Provider Initiated Contact with Patient 06/26/16 [id number]      Chief Complaint  Patient presents with  . Back Pain   Back Pain  This is a new problem. The current episode started today. The problem occurs constantly. The problem has been gradually worsening since onset. The pain is present in the lumbar spine. The quality of the pain is described as stabbing. Radiates to: to the right side of the abdomen.  The pain is at a severity of 9/10. The symptoms are aggravated by position. Associated symptoms include abdominal pain. Pertinent negatives include no dysuria or fever. Treatments tried: tramadol.  The treatment provided no relief.     Past Medical History:  Diagnosis Date  . Abnormal Pap smear 2011   ASCUS  . Complication of anesthesia   . Depression    Post partum  . Hx of bilateral breast reduction surgery 2007  . Hypertension   . PONV (postoperative nausea and vomiting)    nausea  . Vaginal Pap smear, abnormal     Past Surgical History:  Procedure Laterality Date  . BREAST SURGERY     reduction  . DILATION AND CURETTAGE OF UTERUS     with polypectomy  . MASS EXCISION Right 05/15/2016   Procedure: REMOVAL OF RIGHT ARM MASS;  Surgeon: Jackolyn Confer, MD;  Location: [facility];  Service: General;  Laterality: Right;  . THERAPEUTIC ABORTION  2000  . TUBAL LIGATION  02/01/2016  . WISDOM TOOTH EXTRACTION      Family History  Problem Relation Age of Onset  . Hypertension Mother   . Drug abuse Mother     recovering addict  . Hypertension Maternal Aunt   . Diabetes Paternal Aunt   . Hypertension Maternal Grandmother   . Diabetes Paternal Grandmother     Social History  Substance Use Topics  . Smoking status: Current Some Day Smoker    Years: 8.00    Types: Cigars  . Smokeless tobacco: Not on file     Comment: 1 black and MIld a day  . Alcohol use No    Allergies:  Allergies  Allergen  Reactions  . Benadryl [Diphenhydramine Hcl] Shortness Of Breath, Itching and Nausea And Vomiting    IV form    Prescriptions Prior to Admission  Medication Sig Dispense Refill Last Dose  . potassium chloride (K-DUR) 10 MEQ tablet Take 4 tablets (40 mEq total) by mouth daily. 2 tablet 0 06/25/2016 at Unknown time  . traMADol (ULTRAM) 50 MG tablet Take 1 tablet (50 mg total) by mouth every 6 (six) hours as needed. 10 tablet 0 06/25/2016 at Unknown time  . amLODipine (NORVASC) 5 MG tablet Take 1 tablet by mouth daily.  5 More than a month at Unknown time    Review of Systems  Constitutional: Negative for chills and fever.  Gastrointestinal: Positive for abdominal pain, nausea and vomiting. Negative for constipation and diarrhea.  Genitourinary: Positive for frequency. Negative for dysuria and urgency.  Musculoskeletal: Positive for back pain.   Physical Exam   Blood pressure 160/99, pulse 68, temperature 98.6 F (37 C), temperature source Oral, resp. rate 18, height 5\' 3"  (1.6 m), weight 250 lb (113.4 kg), last menstrual period 06/25/2016, SpO2 100 %, unknown if currently breastfeeding.  Physical Exam  Nursing note and vitals reviewed. Constitutional: She is oriented to person, place, and time. She appears well-developed and well-nourished. No distress.  HENT:  Head: Normocephalic.  Cardiovascular: Normal rate.   Respiratory: Effort normal.  GI: Soft. There is no tenderness. There is no rebound.  Genitourinary:  Genitourinary Comments:  External: no lesion Vagina: small amount of white discharge Cervix: pink, smooth, no CMT Uterus: NSSC Adnexa: NT   Musculoskeletal: She exhibits tenderness (along the right side of the thoracic vertebrea.).  Neurological: She is alert and oriented to person, place, and time.  Skin: Skin is warm and dry.  Psychiatric: She has a normal mood and affect.   Results for orders placed or performed during the hospital encounter of 06/26/16 (from the  past 24 hour(s))  Urinalysis, Routine w reflex microscopic (not at [facility])     Status: Abnormal   Collection Time: 06/26/16  1:15 AM  Result Value Ref Range   Color, Urine YELLOW YELLOW   APPearance CLEAR CLEAR   Specific Gravity, Urine 1.010 1.005 - 1.030   pH 8.0 5.0 - 8.0   Glucose, UA NEGATIVE NEGATIVE mg/dL   Hgb urine dipstick LARGE (A) NEGATIVE   Bilirubin Urine NEGATIVE NEGATIVE   Ketones, ur NEGATIVE NEGATIVE mg/dL   Protein, ur NEGATIVE NEGATIVE mg/dL   Nitrite NEGATIVE NEGATIVE   Leukocytes, UA TRACE (A) NEGATIVE  Urine microscopic-add on     Status: Abnormal   Collection Time: 06/26/16  1:15 AM  Result Value Ref Range   Squamous Epithelial / LPF 0-5 (A) NONE SEEN   WBC, UA 0-5 0 - 5 WBC/hpf   RBC / HPF TOO NUMEROUS TO COUNT 0 - 5 RBC/hpf   Bacteria, UA FEW (A) NONE SEEN  Pregnancy, urine POC     Status: None   Collection Time: 06/26/16  1:23 AM  Result Value Ref Range   Preg Test, Ur NEGATIVE NEGATIVE  CBC     Status: Abnormal   Collection Time: 06/26/16  2:04 AM  Result Value Ref Range   WBC 11.3 (H) 4.0 - 10.5 K/uL   RBC 4.72 3.87 - 5.11 MIL/uL   Hemoglobin 11.8 (L) 12.0 - 15.0 g/dL   HCT 35.0 (L) 36.0 - 46.0 %   MCV 74.2 (L) 78.0 - 100.0 fL   MCH 25.0 (L) 26.0 - 34.0 pg   MCHC 33.7 30.0 - 36.0 g/dL   RDW 16.6 (H) 11.5 - 15.5 %   Platelets 311 150 - 400 K/uL    MAU Course  Procedures  MDM Patient has had toradol and phenergan. Patient has also eaten now, and has not vomited. She states that the pain is not better, and she didn't come here for a non-narcotic.   Assessment and Plan   1. Right-sided thoracic back pain    DC home Comfort measures reviewed  RX: flexeril PRN #[id number]  Return to MAU as needed FU with PCP as needed     Mathis Bud 06/26/2016, 1:48 AM
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
## 2017-04-11 IMAGING — DX DG CHEST 2V
2 series · 2 of 2 positions shown · non-contrast
Comparison: 11/09/2008

CLINICAL DATA: Fever and cough for 1 day.

EXAM:
CHEST  2 VIEW

[w chest pa]
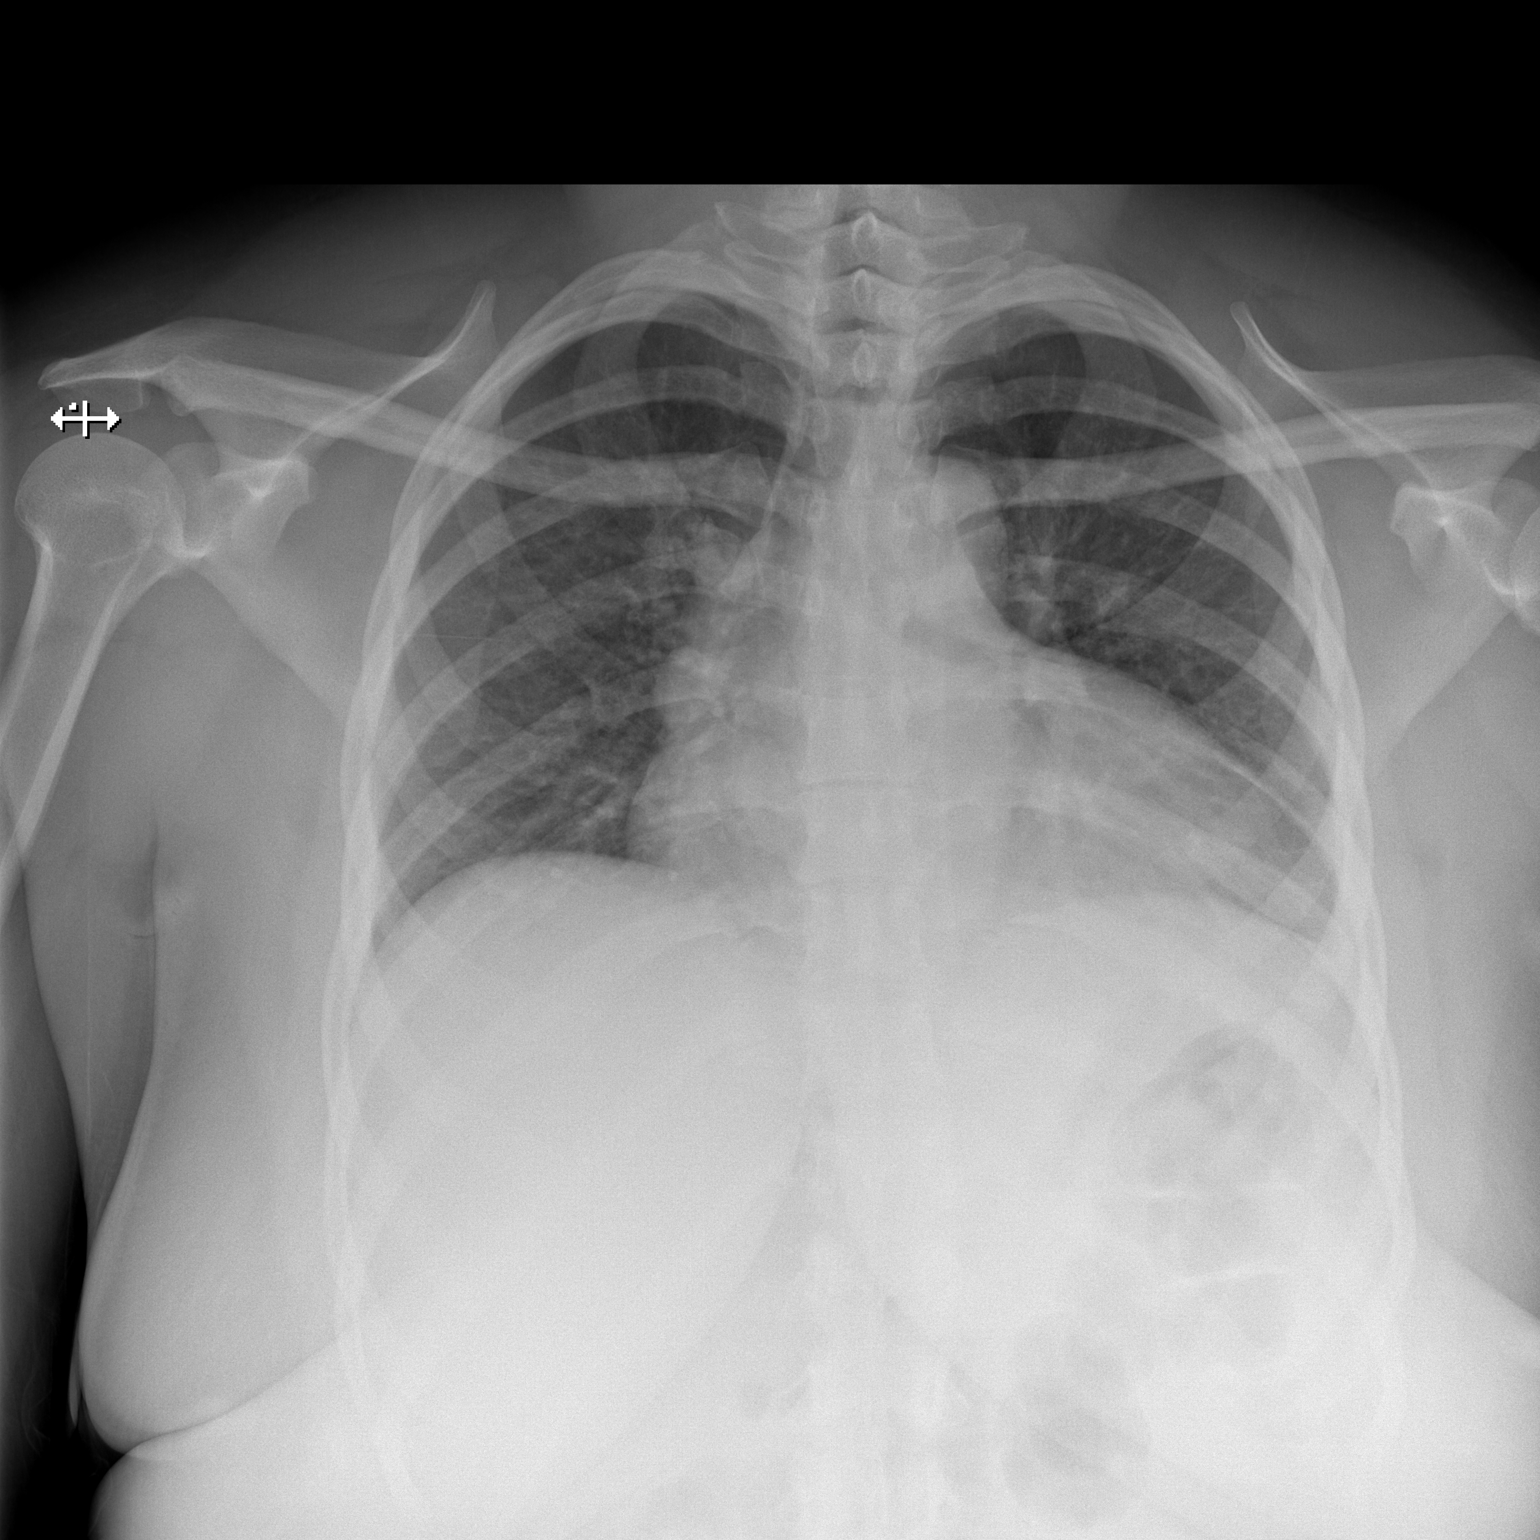

[w chest lat]
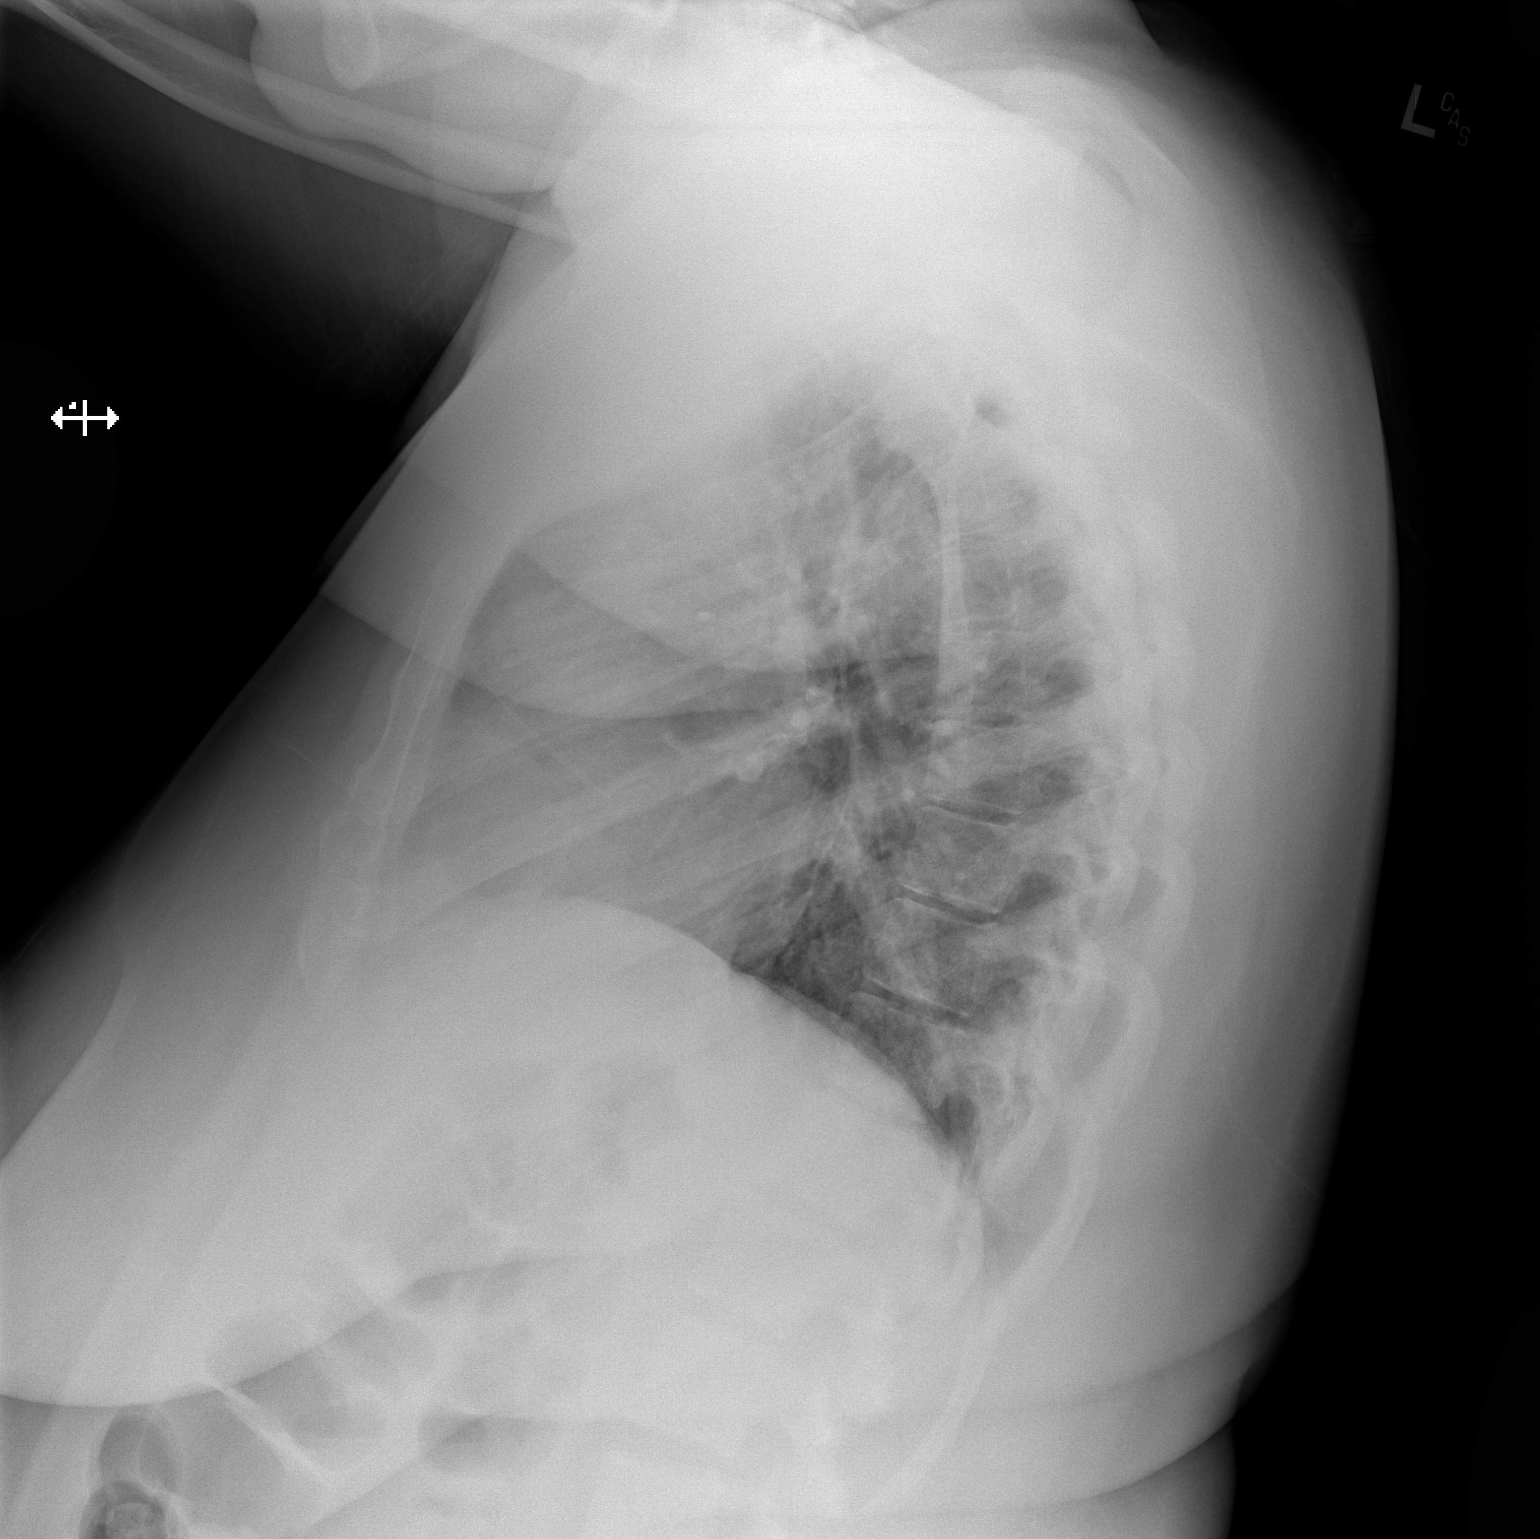

[2 of 2 positions shown; findings below may reference images not displayed]

FINDINGS: The heart size and mediastinal contours are within normal limits.
Both lungs are clear. No pleural effusion or pneumothorax. The
visualized skeletal structures are unremarkable.
IMPRESSION: No active cardiopulmonary disease.

## 2017-08-10 ENCOUNTER — Other Ambulatory Visit: Payer: Self-pay | Admitting: General Surgery

## 2018-01-18 DIAGNOSIS — G4733 Obstructive sleep apnea (adult) (pediatric): Secondary | ICD-10-CM | POA: Diagnosis not present

## 2018-01-18 DIAGNOSIS — I10 Essential (primary) hypertension: Secondary | ICD-10-CM | POA: Diagnosis not present

## 2018-03-01 DIAGNOSIS — I10 Essential (primary) hypertension: Secondary | ICD-10-CM | POA: Diagnosis not present

## 2018-03-01 DIAGNOSIS — D500 Iron deficiency anemia secondary to blood loss (chronic): Secondary | ICD-10-CM | POA: Diagnosis not present

## 2018-03-01 DIAGNOSIS — G43009 Migraine without aura, not intractable, without status migrainosus: Secondary | ICD-10-CM | POA: Diagnosis not present

## 2018-03-14 ENCOUNTER — Encounter: Payer: Self-pay | Admitting: Neurology

## 2018-03-14 ENCOUNTER — Ambulatory Visit: Payer: BLUE CROSS/BLUE SHIELD | Admitting: Neurology

## 2018-03-14 VITALS — BP 158/101 | HR 78 | Ht 63.0 in | Wt 250.0 lb

## 2018-03-14 DIAGNOSIS — R351 Nocturia: Secondary | ICD-10-CM

## 2018-03-14 DIAGNOSIS — R519 Headache, unspecified: Secondary | ICD-10-CM

## 2018-03-14 DIAGNOSIS — Z820 Family history of epilepsy and other diseases of the nervous system: Secondary | ICD-10-CM | POA: Diagnosis not present

## 2018-03-14 DIAGNOSIS — R51 Headache: Secondary | ICD-10-CM | POA: Diagnosis not present

## 2018-03-14 DIAGNOSIS — G4719 Other hypersomnia: Secondary | ICD-10-CM | POA: Diagnosis not present

## 2018-03-14 DIAGNOSIS — R0683 Snoring: Secondary | ICD-10-CM

## 2018-03-14 DIAGNOSIS — Z6841 Body Mass Index (BMI) 40.0 and over, adult: Secondary | ICD-10-CM | POA: Diagnosis not present

## 2018-03-14 NOTE — Patient Instructions (Addendum)
Thank you for choosing [facility] for your sleep related care! It was nice to meet you today! I appreciate that you entrust me with your sleep related healthcare concerns. I hope, I was able to address at least some of your concerns today, and that I can help you feel reassured and also get better.    Here is what we discussed today and what we came up with as our plan for you:    Based on your symptoms and your exam I believe you are at risk for obstructive sleep apnea or OSA, and I think we should proceed with a sleep study to determine whether you do or do not have OSA and how severe it is. If you have more than mild OSA, I want you to consider treatment with CPAP. Please remember, the risks and ramifications of moderate to severe obstructive sleep apnea or OSA are: Cardiovascular disease, including congestive heart failure, stroke, difficult to control hypertension, arrhythmias, and even type 2 diabetes has been linked to untreated OSA. Sleep apnea causes disruption of sleep and sleep deprivation in most cases, which, in turn, can cause recurrent headaches, problems with memory, mood, concentration, focus, and vigilance. Most people with untreated sleep apnea report excessive daytime sleepiness, which can affect their ability to drive. Please do not drive if you feel sleepy.   Please call you PCP about BP management and weight loss avenues.   I will likely see you back after your sleep study to go over the test results and where to go from there. We will call you after your sleep study to advise about the results (most likely, you will hear from Lanagan, my nurse) and to set up an appointment at the time, as necessary.    Our sleep lab administrative assistant will call you to schedule your sleep study. If you don't hear back from her by about 2 weeks from now, please feel free to call her at [phone]. You can leave a message with your phone number and concerns, if you get the  voicemail box. She will call back as soon as possible.

## 2018-03-14 NOTE — Progress Notes (Signed)
Subjective:    Patient ID: Michelle Duarte is a 37 y.o. female.  HPI     Star Age, MD, PhD [facility] [address] [place], [place] [address]  Dear Dr. Ashby Dawes,  I saw your patient, [name], upon your kind request in my neurologic clinic today for initial consultation of her sleep disorder, in particular, concern for underlying obstructive sleep apnea. The patient is unaccompanied today. As you know, [name] is a 37 year old right-handed woman with an underlying medical history of hypertension, hypokalemia, smoking, depression, and morbid obesity with BMI of over 40, who reports snoring and excessive daytime somnolence. I reviewed your office note from 01/18/2014, which you kindly included. Her Epworth sleepiness score is 12 out of 24 today, fatigue score is 45 out of 63. She works for the [employer]. She is married and lives with her husband and her 2 children. She smokes occasionally, she does not utilize alcohol regularly and drinks caffeine in the form of coffee daily as well as soda or tea one serving per day on average. She has a 66 yo daughter, who has her own room, and 2 yo son, who sleeps in the same bed with her, husband sleeps downstairs (mattress is too soft for him, she states). She has had AM HAs, nocturia about 3 times per night. Her mother has sleep apnea and uses a CPAP machine. She has not taken her blood pressure medication for the past 2 days because of ankle swelling which became painful at one point. Of note, she has been on Irbesartan 300 mg and started amlodipine in March. Her bedtime is typically between 11 and midnight but she has already dosed off before she admits. Rise time is 6:45 AM typically. She does not wake up rested. She has had feelings of choking and has been for air.  Her Past Medical History Is Significant For: Past Medical History:  Diagnosis Date  . Abnormal Pap smear 2011    ASCUS  . Complication of anesthesia   . Depression    Post partum  . Hx of bilateral breast reduction surgery 2007  . Hypertension   . Hypokalemia   . Insomnia   . PONV (postoperative nausea and vomiting)    nausea  . Vaginal Pap smear, abnormal     Her Past Surgical History Is Significant For: Past Surgical History:  Procedure Laterality Date  . BREAST SURGERY     reduction  . DILATION AND CURETTAGE OF UTERUS     with polypectomy  . MASS EXCISION Right 05/15/2016   Procedure: REMOVAL OF RIGHT ARM MASS;  Surgeon: Jackolyn Confer, MD;  Location: [facility];  Service: General;  Laterality: Right;  . THERAPEUTIC ABORTION  2000  . TUBAL LIGATION  02/01/2016  . WISDOM TOOTH EXTRACTION      Her Family History Is Significant For: Family History  Problem Relation Age of Onset  . Hypertension Mother   . Drug abuse Mother        recovering addict  . Hypertension Maternal Aunt   . Diabetes Paternal Aunt   . Hypertension Maternal Grandmother   . Diabetes Paternal Grandmother     Her Social History Is Significant For: Social History   Socioeconomic History  . Marital status: Single    Spouse name: Not on file  . Number of children: Not on file  . Years of education: Not on file  . Highest education level: Not on file  Occupational  History  . Not on file  Social Needs  . Financial resource strain: Not on file  . Food insecurity:    Worry: Not on file    Inability: Not on file  . Transportation needs:    Medical: Not on file    Non-medical: Not on file  Tobacco Use  . Smoking status: Current Some Day Smoker    Years: 8.00    Types: Cigars  . Smokeless tobacco: Never Used  . Tobacco comment: 1 black and MIld a day  Substance and Sexual Activity  . Alcohol use: No    Alcohol/week: 0.0 oz  . Drug use: No  . Sexual activity: Yes  Lifestyle  . Physical activity:    Days per week: Not on file    Minutes per session: Not on file  . Stress: Not on file  Relationships  .  Social connections:    Talks on phone: Not on file    Gets together: Not on file    Attends religious service: Not on file    Active member of club or organization: Not on file    Attends meetings of clubs or organizations: Not on file    Relationship status: Not on file  Other Topics Concern  . Not on file  Social History Narrative  . Not on file    Her Allergies Are:  Allergies  Allergen Reactions  . Benadryl [Diphenhydramine Hcl] Shortness Of Breath, Itching and Nausea And Vomiting    IV form  :   Her Current Medications Are:  Outpatient Encounter Medications as of 03/14/2018  Medication Sig  . irbesartan (AVAPRO) 300 MG tablet Take 300 mg by mouth daily.  . potassium chloride (K-DUR) 10 MEQ tablet Take 4 tablets (40 mEq total) by mouth daily.  Marland Kitchen amLODipine (NORVASC) 5 MG tablet Take 1 tablet by mouth daily.  . [DISCONTINUED] cyclobenzaprine (FLEXERIL) 10 MG tablet Take 1 tablet (10 mg total) by mouth 2 (two) times daily as needed for muscle spasms.  . [DISCONTINUED] traMADol (ULTRAM) 50 MG tablet Take 1 tablet (50 mg total) by mouth every 6 (six) hours as needed.   No facility-administered encounter medications on file as of 03/14/2018.   :  Review of Systems:  Out of a complete 14 point review of systems, all are reviewed and negative with the exception of these symptoms as listed below: Review of Systems  Neurological:       Pt presents today to discuss her sleep. Pt endorses snoring but has never had a sleep study.  Epworth Sleepiness Scale 0= would never doze 1= slight chance of dozing 2= moderate chance of dozing 3= high chance of dozing  Sitting and reading: 2 Watching TV: 2 Sitting inactive in a public place (ex. Theater or meeting): 1 As a passenger in a car for an hour without a break: 2 Lying down to rest in the afternoon: 3 Sitting and talking to someone: 0 Sitting quietly after lunch (no alcohol): 2 In a car, while stopped in traffic: 0 Total: 12      Objective:  Neurological Exam  Physical Exam Physical Examination:   Vitals:   03/14/18 1514  BP: (!) 158/101  Pulse: 78   General Examination: The patient is a very pleasant 37 y.o. female in no acute distress. She appears well-developed and well-nourished and well groomed.   HEENT: Normocephalic, atraumatic, pupils are equal, round and reactive to light and accommodation. Extraocular tracking is good without limitation to gaze excursion or  nystagmus noted. Normal smooth pursuit is noted. Hearing is grossly intact. Face is symmetric with normal facial animation and normal facial sensation. Speech is clear with no dysarthria noted. There is no hypophonia. There is no lip, neck/head, jaw or voice tremor. Neck is supple with full range of passive and active motion. There are no carotid bruits on auscultation. Oropharynx exam reveals: mild mouth dryness, good dental hygiene and moderate airway crowding, due to smaller airway entry and tonsils of 1+ b/l. Mallampati is class II. Tongue protrudes centrally and palate elevates symmetrically. Neck size is 18 inches. She has a Mild overbite.    Chest: Clear to auscultation without wheezing, rhonchi or crackles noted.  Heart: S1+S2+0, regular and normal without murmurs, rubs or gallops noted.   Abdomen: Soft, non-tender and non-distended with normal bowel sounds appreciated on auscultation.  Extremities: There is trace pitting edema in the distal lower extremities bilaterally, around the ankle. Pedal pulses are intact.  Skin: Warm and dry without trophic changes noted.  Musculoskeletal: exam reveals no obvious joint deformities, tenderness or joint swelling or erythema.   Neurologically:   Mental status: The patient is awake, alert and oriented in all 4 spheres. Her immediate and remote memory, attention, language skills and fund of knowledge are appropriate. There is no evidence of aphasia, agnosia, apraxia or anomia. Speech is clear with  normal prosody and enunciation. Thought process is linear. Mood is normal and affect is normal.  Cranial nerves II - XII are as described above under HEENT exam. In addition: shoulder shrug is normal with equal shoulder height noted. Motor exam: Normal bulk, strength and tone is noted. There is no drift, tremor or rebound. Romberg is negative. Reflexes are 2+ throughout. Fine motor skills and coordination: intact with normal finger taps, normal hand movements, normal rapid alternating patting, normal foot taps and normal foot agility.  Cerebellar testing: No dysmetria or intention tremor on finger to nose testing. Heel to shin is unremarkable bilaterally. There is no truncal or gait ataxia.  Sensory exam: intact to light touch in the upper and lower extremities.  Gait, station and balance: She stands easily. No veering to one side is noted. No leaning to one side is noted. Posture is age-appropriate and stance is narrow based. Gait shows normal stride length and normal pace. No problems turning are noted. Tandem walk is unremarkable.   Assessment and Plan:   In summary, Tamekia Rotter Cryderman is a very pleasant 37 y.o.-year old female with an underlying medical history of hypertension, hypokalemia, smoking, depression, and morbid obesity with BMI of over 40, whose history and physical exam concerning for obstructive sleep apnea (OSA). I had a long chat with the patient about my findings and the diagnosis of OSA, its prognosis and treatment options. We talked about medical treatments, surgical interventions and non-pharmacological approaches. I explained in particular the risks and ramifications of untreated moderate to severe OSA, especially with respect to developing cardiovascular disease down the Road, including congestive heart failure, difficult to treat hypertension, cardiac arrhythmias, or stroke. Even type 2 diabetes has, in part, been linked to untreated OSA. Symptoms of untreated OSA include daytime  sleepiness, memory problems, mood irritability and mood disorder such as depression and anxiety, lack of energy, as well as recurrent headaches, especially morning headaches. We talked about smoking cessation and trying to maintain a healthy lifestyle in general, as well as the importance of weight control. I encouraged the patient to eat healthy, exercise daily and keep well hydrated, to  keep a scheduled bedtime and wake time routine, to not skip any meals and eat healthy snacks in between meals. I advised the patient not to drive when feeling sleepy. I recommended the following at this time: sleep study with potential positive airway pressure titration. (We will score hypopneas at 3%).   I explained the sleep test procedure to the patient and also outlined possible surgical and non-surgical treatment options of OSA, including the use of a custom-made dental device (which would require a referral to a specialist dentist or oral surgeon), upper airway surgical options, such as pillar implants, radiofrequency surgery, tongue base surgery, and UPPP (which would involve a referral to an ENT surgeon). Rarely, jaw surgery such as mandibular advancement may be considered.  I also explained the CPAP treatment option to the patient, who indicated that she would be willing to try CPAP if the need arises. I explained the importance of being compliant with PAP treatment, not only for insurance purposes but primarily to improve Her symptoms, and for the patient's long term health benefit, including to reduce Her cardiovascular risks. I answered all her questions today and the patient was in agreement. I would like to see her back after the sleep study is completed and encouraged her to call with any interim questions, concerns, problems or updates.   Thank you very much for allowing me to participate in the care of this nice patient. If I can be of any further assistance to you please do not hesitate to call me at  [phone].  Sincerely,   Star Age, MD, PhD

## 2018-03-15 ENCOUNTER — Telehealth: Payer: Self-pay

## 2018-03-15 DIAGNOSIS — G4719 Other hypersomnia: Secondary | ICD-10-CM

## 2018-03-15 NOTE — Telephone Encounter (Signed)
[employer] denied in lab sleep study, need HST order 

## 2018-03-15 NOTE — Telephone Encounter (Signed)
HST order placed. 

## 2018-03-15 NOTE — Telephone Encounter (Signed)
Spoke with patient and explained the denial of cpap titration in the lab. She will wait on DME company to reach her.

## 2018-03-25 ENCOUNTER — Telehealth: Payer: Self-pay | Admitting: Neurology

## 2018-03-25 DIAGNOSIS — G4733 Obstructive sleep apnea (adult) (pediatric): Secondary | ICD-10-CM

## 2018-03-25 NOTE — Procedures (Unsigned)
[facility] [place], [place] [address] NAME:   Michelle Duarte                                                               DOB: Jul 13, 1981 MEDICAL RECORD NUMBER                                                               DOS:  04/12/18 REFERRING PHYSICIAN: Merrilee Seashore, MD STUDY PERFORMED: Home Sleep test (repeat study on [employer]) HISTORY: 37 year old woman with a history of hypertension, hypokalemia, smoking, depression, and morbid obesity, who reports snoring and excessive daytime somnolence. Her Epworth sleepiness score is 12 out of 24 today, BMI: 44.5.   STUDY RESULTS: Total Recording Time: 7 hours, 43 minutes (valid test time: 5 hours, 55 min) Total Apnea/Hypopnea Index (AHI):    24.4/h,  RDI: 27.4/h Average Oxygen Saturation:  95%, Lowest Oxygen Desaturation: 76%  Total Time Oxygen Saturation Below or at 88%:  1.9 minutes  Average Heart Rate:   71 bpm (between 53 and 106  bpm) IMPRESSION: OSA RECOMMENDATION: This home sleep test demonstrates moderate obstructive sleep apnea with a total AHI of 24.4/hour and O2 nadir of 76%. Given the patient's medical history and sleep related complaints, treatment with positive airway pressure (in the form of CPAP) is recommended. This will require a full night CPAP titration study for proper treatment settings, O2 monitoring and mask fitting. Based on the severity of the sleep disordered breathing an attended titration study is indicated. However, patient's insurance has denied an attended sleep study; therefore, the patient will be advised to proceed with an autoPAP titration/trial at home for now. Please note that untreated obstructive sleep apnea may carry additional perioperative morbidity. Patients with significant obstructive sleep apnea should receive perioperative PAP therapy and the surgeons and particularly the anesthesiologist should be informed of the diagnosis and the severity of the sleep  disordered breathing. The patient should be cautioned not to drive, work at heights, or operate dangerous or heavy equipment when tired or sleepy. Review and reiteration of good sleep hygiene measures should be pursued with any patient. Other causes of the patient's symptoms, including circadian rhythm disturbances, an underlying mood disorder, medication effect and/or an underlying medical problem cannot be ruled out based on this test. Clinical correlation is recommended. The patient and his referring provider will be notified of the test results. The patient will be seen in follow up in sleep clinic at [facility]. I certify that I have reviewed the raw data recording prior to the issuance of this report in accordance with the standards of the American Academy of Sleep Medicine (AASM).  Star Age, MD, PhD [facility]) Diplomat, ABPN (Neurology and Sleep)

## 2018-03-25 NOTE — Addendum Note (Signed)
Addended by: Star Age on: 03/25/2018 02:28 PM   Modules accepted: Orders

## 2018-03-25 NOTE — Telephone Encounter (Deleted)
Could not close HST encounter.  Patient referred by Dr. Kieth Brightly, seen by me on 01/26/18, HST on 03/22/18.    Please call and notify the patient that the recent home sleep test showed obstructive sleep apnea in the moderate range and while I recommend a CPAP titration study for proper titration and O2 monitoring (O2 nadir of 66%), her insurance has denied any attended sleep study. I therefore recommend treatment for her OSA in the form of autoPAP, which means, that she can try an autoPAP machine at home, through a DME company (of her choice, or as per insurance requirement). The DME representative will educate her on how to use the machine, how to put the mask on, etc. I have placed an order in the chart. Please send referral, talk to patient, send report to referring MD. We will need a FU in sleep clinic for 10 weeks post-PAP set up, please arrange that with me or one of our NPs. Thanks,   Michelle Age, MD, PhD [facility])

## 2018-03-25 NOTE — Telephone Encounter (Signed)
Please disregard this phone note, erroneously placed in the wrong chart.

## 2018-03-28 NOTE — Telephone Encounter (Deleted)
This encounter was created in error - please disregard.

## 2018-03-28 NOTE — Addendum Note (Signed)
Addended by: Lester Newark A on: 03/28/2018 05:02 PM   Modules accepted: Level of Service, SmartSet

## 2018-03-29 NOTE — Telephone Encounter (Signed)
This encounter was created in error - please disregard.

## 2018-03-29 NOTE — Addendum Note (Signed)
Addended by: Star Age on: 03/29/2018 03:08 PM   Modules accepted: Level of Service, SmartSet

## 2018-04-11 ENCOUNTER — Encounter: Payer: BLUE CROSS/BLUE SHIELD | Admitting: Neurology

## 2018-04-11 DIAGNOSIS — G4710 Hypersomnia, unspecified: Secondary | ICD-10-CM

## 2018-04-15 ENCOUNTER — Telehealth: Payer: Self-pay | Admitting: Neurology

## 2018-04-15 DIAGNOSIS — G4733 Obstructive sleep apnea (adult) (pediatric): Secondary | ICD-10-CM

## 2018-04-15 NOTE — Telephone Encounter (Signed)
Could not close HST encounter.  Patient referred by Dr. Ashby Dawes, seen by me on 03/14/18, HST on 04/12/18, which was repeated.  Please call and notify the patient that the recent home sleep test showed obstructive sleep apnea. OSA is the moderate range and ideally would warrant a lab attended CPAP titration study for proper CPAP therapy (mask fitting, valid titration, O2 monitoring etc.), but her insurance has denied an attended sleep study. Therefore, I recommend treatment for this in the form of autoPAP, which means, that we don't have to bring her in for a sleep study with CPAP, but will let her try an autoPAP machine at home, through a DME company (of her choice, or as per insurance requirement). The DME representative will educate her on how to use the machine, how to put the mask on, etc. I have placed an order in the chart. Please send referral, talk to patient, send report to referring MD. We will need a FU in sleep clinic for 10 weeks post-PAP set up, please arrange that with me or one of our NPs. Thanks,   Star Age, MD, PhD [facility])

## 2018-04-19 NOTE — Telephone Encounter (Signed)
I called pt to discuss. No answer, left a message asking her to call me back. 

## 2018-04-19 NOTE — Telephone Encounter (Signed)
I called pt. I advised pt that Dr. Rexene Alberts reviewed their sleep study results and found that pt has moderate osa. Dr. Rexene Alberts recommends that pt start an auto pap, since pt's insurance has denied an in-lab sleep study for cpap titration. I reviewed PAP compliance expectations with the pt. Pt is agreeable to starting an auto-PAP. I advised pt that an order will be sent to a DME, [facility], and [facility] will call the pt within about one week after they file with the pt's insurance. [facility] will show the pt how to use the machine, fit for masks, and troubleshoot the auto-PAP if needed. A follow up appt was made for insurance purposes with Dr. Rexene Alberts on 07/19/18 at 10:30am. Pt verbalized understanding to arrive 15 minutes early and bring their auto-PAP. A letter with all of this information in it will be mailed to the pt as a reminder. I verified with the pt that the address we have on file is correct. Pt verbalized understanding of results. Pt had no questions at this time but was encouraged to call back if questions arise.

## 2018-04-20 DIAGNOSIS — I10 Essential (primary) hypertension: Secondary | ICD-10-CM | POA: Diagnosis not present

## 2018-04-20 DIAGNOSIS — D500 Iron deficiency anemia secondary to blood loss (chronic): Secondary | ICD-10-CM | POA: Diagnosis not present

## 2018-05-02 DIAGNOSIS — N951 Menopausal and female climacteric states: Secondary | ICD-10-CM | POA: Diagnosis not present

## 2018-05-02 DIAGNOSIS — R635 Abnormal weight gain: Secondary | ICD-10-CM | POA: Diagnosis not present

## 2018-05-03 NOTE — Telephone Encounter (Signed)
Received this notice from [employer]: "I spoke with patient and she advised that she doesn't want to move forward with getting the machine at this time. She said she doesn't want to commit to the monthly payments when she doesn't really want the machine or to have to wear the machine. She said that she would try to loose some weight and make some adjustments with her health to see if that would help her. I am voiding her sales order for the time being."  I called pt to discuss. No answer, left a message asking her to call me back.

## 2018-05-10 NOTE — Telephone Encounter (Signed)
I called pt to discuss. No answer, VM full. Will send pt a letter asking her to call me back.

## 2018-05-26 DIAGNOSIS — Z1331 Encounter for screening for depression: Secondary | ICD-10-CM | POA: Diagnosis not present

## 2018-05-26 DIAGNOSIS — E782 Mixed hyperlipidemia: Secondary | ICD-10-CM | POA: Diagnosis not present

## 2018-05-26 DIAGNOSIS — Z72820 Sleep deprivation: Secondary | ICD-10-CM | POA: Diagnosis not present

## 2018-05-26 DIAGNOSIS — R4586 Emotional lability: Secondary | ICD-10-CM | POA: Diagnosis not present

## 2018-05-26 DIAGNOSIS — N951 Menopausal and female climacteric states: Secondary | ICD-10-CM | POA: Diagnosis not present

## 2018-05-26 DIAGNOSIS — R232 Flushing: Secondary | ICD-10-CM | POA: Diagnosis not present

## 2018-05-26 DIAGNOSIS — Z1339 Encounter for screening examination for other mental health and behavioral disorders: Secondary | ICD-10-CM | POA: Diagnosis not present

## 2018-05-31 DIAGNOSIS — E039 Hypothyroidism, unspecified: Secondary | ICD-10-CM | POA: Diagnosis not present

## 2018-05-31 DIAGNOSIS — R7989 Other specified abnormal findings of blood chemistry: Secondary | ICD-10-CM | POA: Diagnosis not present

## 2018-05-31 DIAGNOSIS — R4586 Emotional lability: Secondary | ICD-10-CM | POA: Diagnosis not present

## 2018-05-31 DIAGNOSIS — E782 Mixed hyperlipidemia: Secondary | ICD-10-CM | POA: Diagnosis not present

## 2018-06-07 DIAGNOSIS — I10 Essential (primary) hypertension: Secondary | ICD-10-CM | POA: Diagnosis not present

## 2018-06-09 DIAGNOSIS — Z01419 Encounter for gynecological examination (general) (routine) without abnormal findings: Secondary | ICD-10-CM | POA: Diagnosis not present

## 2018-06-09 DIAGNOSIS — Z6841 Body Mass Index (BMI) 40.0 and over, adult: Secondary | ICD-10-CM | POA: Diagnosis not present

## 2018-06-14 DIAGNOSIS — M2550 Pain in unspecified joint: Secondary | ICD-10-CM | POA: Diagnosis not present

## 2018-06-21 DIAGNOSIS — E782 Mixed hyperlipidemia: Secondary | ICD-10-CM | POA: Diagnosis not present

## 2018-06-21 DIAGNOSIS — I10 Essential (primary) hypertension: Secondary | ICD-10-CM | POA: Diagnosis not present

## 2018-06-21 DIAGNOSIS — R5383 Other fatigue: Secondary | ICD-10-CM | POA: Diagnosis not present

## 2018-07-19 ENCOUNTER — Ambulatory Visit: Payer: Self-pay | Admitting: Neurology

## 2018-11-15 DIAGNOSIS — Z0000 Encounter for general adult medical examination without abnormal findings: Secondary | ICD-10-CM | POA: Diagnosis not present

## 2018-11-15 DIAGNOSIS — I10 Essential (primary) hypertension: Secondary | ICD-10-CM | POA: Diagnosis not present

## 2018-11-23 DIAGNOSIS — J069 Acute upper respiratory infection, unspecified: Secondary | ICD-10-CM | POA: Diagnosis not present

## 2018-11-23 DIAGNOSIS — E876 Hypokalemia: Secondary | ICD-10-CM | POA: Diagnosis not present

## 2018-11-23 DIAGNOSIS — E261 Secondary hyperaldosteronism: Secondary | ICD-10-CM | POA: Diagnosis not present

## 2018-11-23 DIAGNOSIS — Z0000 Encounter for general adult medical examination without abnormal findings: Secondary | ICD-10-CM | POA: Diagnosis not present

## 2018-12-28 ENCOUNTER — Telehealth: Payer: Self-pay | Admitting: Neurology

## 2018-12-28 NOTE — Telephone Encounter (Signed)
Please call patient back: I would recommend that she make an appointment with her primary care physician for ringing of the ears as he may want her to see an ENT physician for this.

## 2018-12-28 NOTE — Telephone Encounter (Signed)
Pt called today to make an appointment for ringing in the ears. I advised her she would need to f/u with PCP 1st since Dr Rexene Alberts has not seen her for this. She said they discussed this at her OV as well as HA's. I told her I saw documentation of the HA's but not the ringing in the ears. Please call to advise

## 2019-01-20 DIAGNOSIS — R112 Nausea with vomiting, unspecified: Secondary | ICD-10-CM | POA: Diagnosis not present

## 2019-01-20 DIAGNOSIS — R197 Diarrhea, unspecified: Secondary | ICD-10-CM | POA: Diagnosis not present

## 2019-01-26 DIAGNOSIS — I10 Essential (primary) hypertension: Secondary | ICD-10-CM | POA: Diagnosis not present

## 2019-02-01 DIAGNOSIS — Z6841 Body Mass Index (BMI) 40.0 and over, adult: Secondary | ICD-10-CM | POA: Diagnosis not present

## 2019-02-01 DIAGNOSIS — Z713 Dietary counseling and surveillance: Secondary | ICD-10-CM | POA: Diagnosis not present

## 2019-02-21 DIAGNOSIS — I10 Essential (primary) hypertension: Secondary | ICD-10-CM | POA: Diagnosis not present

## 2019-03-09 DIAGNOSIS — I10 Essential (primary) hypertension: Secondary | ICD-10-CM | POA: Diagnosis not present

## 2019-03-10 DIAGNOSIS — I10 Essential (primary) hypertension: Secondary | ICD-10-CM | POA: Diagnosis not present

## 2019-03-10 DIAGNOSIS — Z7189 Other specified counseling: Secondary | ICD-10-CM | POA: Diagnosis not present

## 2019-03-28 DIAGNOSIS — Z7189 Other specified counseling: Secondary | ICD-10-CM | POA: Diagnosis not present

## 2019-03-28 DIAGNOSIS — Z6841 Body Mass Index (BMI) 40.0 and over, adult: Secondary | ICD-10-CM | POA: Diagnosis not present

## 2019-03-29 DIAGNOSIS — M6281 Muscle weakness (generalized): Secondary | ICD-10-CM | POA: Diagnosis not present

## 2019-04-11 DIAGNOSIS — L700 Acne vulgaris: Secondary | ICD-10-CM | POA: Diagnosis not present

## 2019-04-12 DIAGNOSIS — Z7189 Other specified counseling: Secondary | ICD-10-CM | POA: Diagnosis not present

## 2019-04-12 DIAGNOSIS — Z6841 Body Mass Index (BMI) 40.0 and over, adult: Secondary | ICD-10-CM | POA: Diagnosis not present

## 2019-06-13 DIAGNOSIS — Z01419 Encounter for gynecological examination (general) (routine) without abnormal findings: Secondary | ICD-10-CM | POA: Diagnosis not present

## 2019-06-13 DIAGNOSIS — Z6841 Body Mass Index (BMI) 40.0 and over, adult: Secondary | ICD-10-CM | POA: Diagnosis not present

## 2019-06-24 DIAGNOSIS — M542 Cervicalgia: Secondary | ICD-10-CM | POA: Diagnosis not present

## 2019-07-21 DIAGNOSIS — I10 Essential (primary) hypertension: Secondary | ICD-10-CM | POA: Diagnosis not present

## 2019-07-27 DIAGNOSIS — G4733 Obstructive sleep apnea (adult) (pediatric): Secondary | ICD-10-CM | POA: Diagnosis not present

## 2019-07-27 DIAGNOSIS — I10 Essential (primary) hypertension: Secondary | ICD-10-CM | POA: Diagnosis not present

## 2019-07-27 DIAGNOSIS — I158 Other secondary hypertension: Secondary | ICD-10-CM | POA: Diagnosis not present

## 2019-07-27 DIAGNOSIS — E876 Hypokalemia: Secondary | ICD-10-CM | POA: Diagnosis not present

## 2019-08-24 DIAGNOSIS — Z6841 Body Mass Index (BMI) 40.0 and over, adult: Secondary | ICD-10-CM | POA: Diagnosis not present

## 2019-08-24 DIAGNOSIS — Z713 Dietary counseling and surveillance: Secondary | ICD-10-CM | POA: Diagnosis not present

## 2019-09-07 DIAGNOSIS — Z6841 Body Mass Index (BMI) 40.0 and over, adult: Secondary | ICD-10-CM | POA: Diagnosis not present

## 2019-09-07 DIAGNOSIS — I10 Essential (primary) hypertension: Secondary | ICD-10-CM | POA: Diagnosis not present

## 2019-09-07 DIAGNOSIS — Z713 Dietary counseling and surveillance: Secondary | ICD-10-CM | POA: Diagnosis not present

## 2019-09-07 DIAGNOSIS — E876 Hypokalemia: Secondary | ICD-10-CM | POA: Diagnosis not present

## 2019-09-14 DIAGNOSIS — Z6841 Body Mass Index (BMI) 40.0 and over, adult: Secondary | ICD-10-CM | POA: Diagnosis not present

## 2019-09-14 DIAGNOSIS — Z713 Dietary counseling and surveillance: Secondary | ICD-10-CM | POA: Diagnosis not present

## 2019-09-21 DIAGNOSIS — E261 Secondary hyperaldosteronism: Secondary | ICD-10-CM | POA: Diagnosis not present

## 2019-09-21 DIAGNOSIS — I10 Essential (primary) hypertension: Secondary | ICD-10-CM | POA: Diagnosis not present

## 2019-09-21 DIAGNOSIS — R9431 Abnormal electrocardiogram [ECG] [EKG]: Secondary | ICD-10-CM | POA: Diagnosis not present

## 2019-09-21 DIAGNOSIS — Z01818 Encounter for other preprocedural examination: Secondary | ICD-10-CM | POA: Diagnosis not present

## 2019-10-04 ENCOUNTER — Encounter: Payer: Self-pay | Admitting: Cardiology

## 2019-10-04 ENCOUNTER — Other Ambulatory Visit: Payer: Self-pay

## 2019-10-04 ENCOUNTER — Ambulatory Visit (INDEPENDENT_AMBULATORY_CARE_PROVIDER_SITE_OTHER): Payer: BC Managed Care – PPO | Admitting: Cardiology

## 2019-10-04 VITALS — BP 153/91 | HR 74 | Temp 98.6°F | Ht 63.0 in | Wt 262.0 lb

## 2019-10-04 DIAGNOSIS — Z01810 Encounter for preprocedural cardiovascular examination: Secondary | ICD-10-CM | POA: Insufficient documentation

## 2019-10-04 DIAGNOSIS — I10 Essential (primary) hypertension: Secondary | ICD-10-CM

## 2019-10-04 DIAGNOSIS — R9431 Abnormal electrocardiogram [ECG] [EKG]: Secondary | ICD-10-CM

## 2019-10-04 NOTE — Progress Notes (Signed)
Patient referred by Merrilee Seashore, MD for abnormal EKG, preoperative stratification.  Subjective:   Michelle Duarte, female    DOB: 06-01-1981, 38 y.o.   MRN: [id number]   Chief Complaint  Patient presents with  . Abnormal ECG  . New Patient (Initial Visit)    HPI  38 y.o. African-American female with hypertension, obesity, referred for evaluation of abnormal EKG.  Patient denies chest pain, shortness of breath, palpitations, leg edema, orthopnea, PND, TIA/syncope.  She tries to stay active with her work, and exercise.  She is looking forward to getting bariatric surgery.  Past Medical History:  Diagnosis Date  . Abnormal Pap smear 2011   ASCUS  . Complication of anesthesia   . Depression    Post partum  . Hx of bilateral breast reduction surgery 2007  . Hypertension   . Hypokalemia   . Insomnia   . PONV (postoperative nausea and vomiting)    nausea  . Vaginal Pap smear, abnormal      Past Surgical History:  Procedure Laterality Date  . BREAST SURGERY     reduction  . DILATION AND CURETTAGE OF UTERUS     with polypectomy  . MASS EXCISION Right 05/15/2016   Procedure: REMOVAL OF RIGHT ARM MASS;  Surgeon: Jackolyn Confer, MD;  Location: [place];  Service: General;  Laterality: Right;  . THERAPEUTIC ABORTION  2000  . TUBAL LIGATION  02/01/2016  . WISDOM TOOTH EXTRACTION       Social History   Socioeconomic History  . Marital status: Single    Spouse name: Not on file  . Number of children: Not on file  . Years of education: Not on file  . Highest education level: Not on file  Occupational History  . Not on file  Social Needs  . Financial resource strain: Not on file  . Food insecurity    Worry: Not on file    Inability: Not on file  . Transportation needs    Medical: Not on file    Non-medical: Not on file  Tobacco Use  . Smoking status: Current Some Day Smoker    Years: 8.00    Types: Cigars  . Smokeless tobacco: Never Used  . Tobacco comment:  1 black and MIld a day  Substance and Sexual Activity  . Alcohol use: No    Alcohol/week: 0.0 standard drinks  . Drug use: No  . Sexual activity: Yes  Lifestyle  . Physical activity    Days per week: Not on file    Minutes per session: Not on file  . Stress: Not on file  Relationships  . Social Herbalist on phone: Not on file    Gets together: Not on file    Attends religious service: Not on file    Active member of club or organization: Not on file    Attends meetings of clubs or organizations: Not on file    Relationship status: Not on file  . Intimate partner violence    Fear of current or ex partner: Not on file    Emotionally abused: Not on file    Physically abused: Not on file    Forced sexual activity: Not on file  Other Topics Concern  . Not on file  Social History Narrative  . Not on file     Family History  Problem Relation Age of Onset  . Hypertension Mother   . Drug abuse Mother  recovering addict  . Hypertension Maternal Aunt   . Diabetes Paternal Aunt   . Hypertension Maternal Grandmother   . Diabetes Paternal Grandmother      Current Outpatient Medications on File Prior to Visit  Medication Sig Dispense Refill  . amLODipine (NORVASC) 5 MG tablet Take 1 tablet by mouth daily.  5  . irbesartan (AVAPRO) 300 MG tablet Take 300 mg by mouth daily.    . potassium chloride (K-DUR) 10 MEQ tablet Take 4 tablets (40 mEq total) by mouth daily. 2 tablet 0   No current facility-administered medications on file prior to visit.     Cardiovascular studies:  EKG 09/21/2019: Sinus rhythm 68 bpm.  Leftward axis   Recent labs: H/H 11.6/36.2. MCV 75.6. Platelets 359.    Review of Systems  Constitution: Negative for decreased appetite, malaise/fatigue, weight gain and weight loss.  HENT: Negative for congestion.   Eyes: Negative for visual disturbance.  Cardiovascular: Negative for chest pain, dyspnea on exertion, leg swelling, palpitations  and syncope.  Respiratory: Negative for cough.   Endocrine: Negative for cold intolerance.  Hematologic/Lymphatic: Does not bruise/bleed easily.  Skin: Negative for itching and rash.  Musculoskeletal: Negative for myalgias.  Gastrointestinal: Negative for abdominal pain, nausea and vomiting.  Genitourinary: Negative for dysuria.  Neurological: Negative for dizziness and weakness.  Psychiatric/Behavioral: The patient is not nervous/anxious.   All other systems reviewed and are negative.        Vitals:   10/04/19 1051  BP: (!) 153/91  Pulse: 74  Temp: 98.6 F (37 C)  SpO2: 98%     Body mass index is 46.41 kg/m. Filed Weights   10/04/19 1051  Weight: 262 lb (118.8 kg)     Objective:   Physical Exam  Constitutional: She is oriented to person, place, and time. She appears well-developed and well-nourished. No distress.  HENT:  Head: Normocephalic and atraumatic.  Eyes: Pupils are equal, round, and reactive to light. Conjunctivae are normal.  Neck: No JVD present.  Cardiovascular: Normal rate, regular rhythm and intact distal pulses.  No murmur heard. Pulmonary/Chest: Effort normal and breath sounds normal. She has no wheezes. She has no rales.  Abdominal: Soft. Bowel sounds are normal. There is no rebound.  Musculoskeletal:        General: No edema.  Lymphadenopathy:    She has no cervical adenopathy.  Neurological: She is alert and oriented to person, place, and time. No cranial nerve deficit.  Skin: Skin is warm and dry.  Psychiatric: She has a normal mood and affect.  Nursing note and vitals reviewed.         Assessment & Recommendations:   38 y.o. African-American female with hypertension, obesity, referred for evaluation of abnormal EKG.  Abnormal EKG, pre-op risk stratification: Left axis deviation, likely due to longstanding hypertension.  Good baseline functional capacity without any anginal symptoms.  Recommend echocardiogram, but does not need any  stress testing.  If echocardiogram shows structurally normal heart, may proceed with bariatric surgery.  Hypertension: Continue follow-up with PCP.  No changes made today.   Thank you for referring the patient to [place]. Please feel free to contact with any questions.  Nigel Mormon, MD [facility]. [place] Pager: [phone] Office: [phone]

## 2019-10-12 ENCOUNTER — Other Ambulatory Visit: Payer: Self-pay

## 2019-10-12 ENCOUNTER — Ambulatory Visit (INDEPENDENT_AMBULATORY_CARE_PROVIDER_SITE_OTHER): Payer: BC Managed Care – PPO

## 2019-10-12 DIAGNOSIS — R9431 Abnormal electrocardiogram [ECG] [EKG]: Secondary | ICD-10-CM

## 2019-10-12 DIAGNOSIS — I10 Essential (primary) hypertension: Secondary | ICD-10-CM | POA: Diagnosis not present

## 2019-10-16 NOTE — Progress Notes (Signed)
Called pt to inform her about her echo results. Pt mention if we could send the results to her surgeon. Will wait for the request from them.

## 2019-10-30 ENCOUNTER — Ambulatory Visit
Admission: EM | Admit: 2019-10-30 | Discharge: 2019-10-30 | Disposition: A | Discharge status: Home / Self Care | Payer: BLUE CROSS/BLUE SHIELD | Attending: Physician Assistant | Admitting: Physician Assistant

## 2019-10-30 DIAGNOSIS — Z20828 Contact with and (suspected) exposure to other viral communicable diseases: Secondary | ICD-10-CM

## 2019-10-30 DIAGNOSIS — J029 Acute pharyngitis, unspecified: Secondary | ICD-10-CM | POA: Diagnosis not present

## 2019-10-30 DIAGNOSIS — Z20822 Contact with and (suspected) exposure to covid-19: Secondary | ICD-10-CM

## 2019-10-30 DIAGNOSIS — I10 Essential (primary) hypertension: Secondary | ICD-10-CM | POA: Diagnosis not present

## 2019-10-30 NOTE — ED Triage Notes (Signed)
Pt c/o feeling bad since yesterday, sore throat, chills, diarrhea, fatigue, muscle aches, and loss of taste/smell.

## 2019-10-30 NOTE — Discharge Instructions (Addendum)
COVID PCR testing ordered. I would like you to quarantine until testing results. You can take over the counter flonase/nasacort to help with nasal congestion/drainage. If experiencing shortness of breath, trouble breathing, go to the emergency department for further evaluation needed.

## 2019-10-30 NOTE — ED Provider Notes (Signed)
[facility]    CSN: [id number] Arrival date & time: 10/30/19  [id number]      History   Chief Complaint Chief Complaint  Patient presents with  . Sore Throat    HPI Michelle Duarte is a 38 y.o. female.   38 year old female comes in for 2 day of URI symptoms. Has had sore throat, chills, diarrhea, fatigue, body aches, loss of taste/smell. Minimal cough.  Denies fever.  Denies abdominal pain, nausea, vomiting.  Denies shortness of breath.  Positive Covid contact about 5 days ago. Former smoker.      Past Medical History:  Diagnosis Date  . Abnormal Pap smear 2011   ASCUS  . Complication of anesthesia   . Depression    Post partum  . Hx of bilateral breast reduction surgery 2007  . Hypertension   . Hypokalemia   . Insomnia   . PONV (postoperative nausea and vomiting)    nausea  . Vaginal Pap smear, abnormal     Patient Active Problem List   Diagnosis Date Noted  . Nonspecific abnormal electrocardiogram (ECG) (EKG) 10/04/2019  . Preop cardiovascular exam 10/04/2019  . Chronic hypertension in pregnancy 12/09/2015  . Left hip pain 02/21/2015  . BP (high blood pressure) 01/17/2015  . Vaginal delivery 08/06/2011  . CONSTIPATION 01/19/2008    Past Surgical History:  Procedure Laterality Date  . BREAST SURGERY     reduction  . DILATION AND CURETTAGE OF UTERUS     with polypectomy  . MASS EXCISION Right 05/15/2016   Procedure: REMOVAL OF RIGHT ARM MASS;  Surgeon: Jackolyn Confer, MD;  Location: [place];  Service: General;  Laterality: Right;  . THERAPEUTIC ABORTION  2000  . TUBAL LIGATION  02/01/2016  . WISDOM TOOTH EXTRACTION      OB History    Gravida  4   Para  2   Term  2   Preterm      AB  2   Living  2     SAB  1   TAB  1   Ectopic      Multiple  0   Live Births  2            Home Medications    Prior to Admission medications   Medication Sig Start Date End Date Taking? Authorizing Provider  amLODipine (NORVASC) 5 MG tablet  Take 1 tablet by mouth daily. 03/02/16   [provider]  BIOTIN PO Take by mouth daily.    [provider]  Cholecalciferol (VITAMIN D3 PO) Take by mouth. Few times a week    [provider]  irbesartan (AVAPRO) 300 MG tablet Take 300 mg by mouth daily.    [provider]  potassium chloride (K-DUR) 10 MEQ tablet Take 4 tablets (40 mEq total) by mouth daily. Patient taking differently: Take by mouth 2 (two) times daily.  05/15/16   Jackolyn Confer, MD    Family History Family History  Problem Relation Age of Onset  . Hypertension Mother   . Drug abuse Mother        recovering addict  . Stroke Mother   . Heart disease Mother   . Hypertension Maternal Aunt   . Diabetes Paternal Aunt   . Hypertension Maternal Grandmother   . Diabetes Paternal Grandmother   . Hypertension Brother     Social History Social History   Tobacco Use  . Smoking status: Former Smoker    Years: 8.00  Types: Cigars    Quit date: 08/2019    Years since quitting: 0.2  . Smokeless tobacco: Never Used  . Tobacco comment: 1 black and MIld a day  Substance Use Topics  . Alcohol use: No    Alcohol/week: 0.0 standard drinks  . Drug use: No     Allergies   Benadryl [diphenhydramine hcl]   Review of Systems Review of Systems  Reason unable to perform ROS: See HPI as above.     Physical Exam Triage Vital Signs ED Triage Vitals [10/30/19 1126]  Enc Vitals Group     BP (!) 152/102     Pulse Rate 82     Resp 18     Temp 99.5 F (37.5 C)     Temp Source Oral     SpO2 98 %     Weight      Height      Head Circumference      Peak Flow      Pain Score 0     Pain Loc      Pain Edu?      Excl. in [place]?    No data found.  Updated Vital Signs BP (!) 152/102 (BP Location: Left Arm)   Pulse 82   Temp 99.5 F (37.5 C) (Oral)   Resp 18   SpO2 98%   Physical Exam Constitutional:      General: She is not in acute distress.    Appearance: Normal appearance.  She is not ill-appearing, toxic-appearing or diaphoretic.  HENT:     Head: Normocephalic and atraumatic.     Mouth/Throat:     Mouth: Mucous membranes are moist.     Pharynx: Oropharynx is clear. Uvula midline.  Cardiovascular:     Rate and Rhythm: Normal rate and regular rhythm.     Heart sounds: Normal heart sounds. No murmur. No friction rub. No gallop.   Pulmonary:     Effort: Pulmonary effort is normal. No accessory muscle usage, prolonged expiration, respiratory distress or retractions.     Comments: Lungs clear to auscultation without adventitious lung sounds. Musculoskeletal:     Cervical back: Normal range of motion and neck supple.  Neurological:     General: No focal deficit present.     Mental Status: She is alert and oriented to person, place, and time.      UC Treatments / Results  Labs (all labs ordered are listed, but only abnormal results are displayed) Labs Reviewed  NOVEL CORONAVIRUS, NAA    EKG   Radiology No results found.  Procedures Procedures (including critical care time)  Medications Ordered in UC Medications - No data to display  Initial Impression / Assessment and Plan / UC Course  I have reviewed the triage vital signs and the nursing notes.  Pertinent labs & imaging results that were available during my care of the patient were reviewed by me and considered in my medical decision making (see chart for details).    Patient requesting rapid COVID, discussed that currently does not meet criteria for rapid testing. Patient stated she maybe needs to go to the ED for rapid COVID testing. Long discussion that her current exam is stable without alarming signs, and does not require to be at the ED.   COVID PCR test ordered. Patient to quarantine until testing results return. Symptomatic treatment discussed.  Push fluids.  Return precautions given.  Patient expresses understanding and agrees to plan.  Final Clinical Impressions(s) / UC Diagnoses  Final diagnoses:  Sore throat  Exposure to COVID-19 virus  Suspected COVID-19 virus infection   ED Prescriptions    None     PDMP not reviewed this encounter.   Ok Edwards, PA-C 10/30/19 1159

## 2019-10-31 LAB — NOVEL CORONAVIRUS, NAA: SARS-CoV-2, NAA: DETECTED — AB

## 2019-11-01 ENCOUNTER — Telehealth: Payer: Self-pay | Admitting: Emergency Medicine

## 2019-11-01 NOTE — Telephone Encounter (Signed)
Patient called concerning her positive COVID test.  Discussed quarantine recommendations from [facility], what to do if her home symptom management becomes unsuccessful, and ER precautions.  Patient verbalized understanding.

## 2019-11-13 ENCOUNTER — Telehealth: Payer: Self-pay

## 2019-11-29 DIAGNOSIS — Z6841 Body Mass Index (BMI) 40.0 and over, adult: Secondary | ICD-10-CM | POA: Diagnosis not present

## 2019-11-29 DIAGNOSIS — Z79899 Other long term (current) drug therapy: Secondary | ICD-10-CM | POA: Diagnosis not present

## 2019-11-29 DIAGNOSIS — I10 Essential (primary) hypertension: Secondary | ICD-10-CM | POA: Diagnosis not present

## 2019-11-29 DIAGNOSIS — F17200 Nicotine dependence, unspecified, uncomplicated: Secondary | ICD-10-CM | POA: Diagnosis not present

## 2019-11-29 DIAGNOSIS — G4733 Obstructive sleep apnea (adult) (pediatric): Secondary | ICD-10-CM | POA: Diagnosis not present

## 2019-11-29 DIAGNOSIS — K219 Gastro-esophageal reflux disease without esophagitis: Secondary | ICD-10-CM | POA: Diagnosis not present

## 2019-12-21 DIAGNOSIS — Z01818 Encounter for other preprocedural examination: Secondary | ICD-10-CM | POA: Diagnosis not present

## 2019-12-21 DIAGNOSIS — Z6841 Body Mass Index (BMI) 40.0 and over, adult: Secondary | ICD-10-CM | POA: Diagnosis not present

## 2019-12-21 DIAGNOSIS — I10 Essential (primary) hypertension: Secondary | ICD-10-CM | POA: Diagnosis not present

## 2020-01-03 DIAGNOSIS — Z01818 Encounter for other preprocedural examination: Secondary | ICD-10-CM | POA: Diagnosis not present

## 2020-01-03 DIAGNOSIS — I10 Essential (primary) hypertension: Secondary | ICD-10-CM | POA: Diagnosis not present

## 2020-01-22 DIAGNOSIS — Z6841 Body Mass Index (BMI) 40.0 and over, adult: Secondary | ICD-10-CM | POA: Diagnosis not present

## 2020-01-22 DIAGNOSIS — I10 Essential (primary) hypertension: Secondary | ICD-10-CM | POA: Diagnosis not present

## 2020-01-22 DIAGNOSIS — Z7984 Long term (current) use of oral hypoglycemic drugs: Secondary | ICD-10-CM | POA: Diagnosis not present

## 2020-01-22 DIAGNOSIS — E8881 Metabolic syndrome: Secondary | ICD-10-CM | POA: Diagnosis not present

## 2020-01-31 DIAGNOSIS — R112 Nausea with vomiting, unspecified: Secondary | ICD-10-CM | POA: Diagnosis not present

## 2020-01-31 DIAGNOSIS — Z9889 Other specified postprocedural states: Secondary | ICD-10-CM | POA: Diagnosis not present

## 2020-02-06 DIAGNOSIS — Z6841 Body Mass Index (BMI) 40.0 and over, adult: Secondary | ICD-10-CM | POA: Diagnosis not present

## 2020-02-06 DIAGNOSIS — Z713 Dietary counseling and surveillance: Secondary | ICD-10-CM | POA: Diagnosis not present

## 2020-03-06 DIAGNOSIS — K912 Postsurgical malabsorption, not elsewhere classified: Secondary | ICD-10-CM | POA: Insufficient documentation

## 2020-03-06 DIAGNOSIS — Z903 Acquired absence of stomach [part of]: Secondary | ICD-10-CM | POA: Insufficient documentation

## 2020-05-08 ENCOUNTER — Emergency Department (HOSPITAL_COMMUNITY)
Admission: EM | Admit: 2020-05-08 | Discharge: 2020-05-08 | Disposition: A | Discharge status: Home / Self Care | Payer: BC Managed Care – PPO | Attending: Emergency Medicine | Admitting: Emergency Medicine

## 2020-05-08 ENCOUNTER — Other Ambulatory Visit: Payer: Self-pay

## 2020-05-08 ENCOUNTER — Encounter (HOSPITAL_COMMUNITY): Payer: Self-pay

## 2020-05-08 DIAGNOSIS — Z87891 Personal history of nicotine dependence: Secondary | ICD-10-CM | POA: Insufficient documentation

## 2020-05-08 DIAGNOSIS — I10 Essential (primary) hypertension: Secondary | ICD-10-CM | POA: Insufficient documentation

## 2020-05-08 DIAGNOSIS — Z79899 Other long term (current) drug therapy: Secondary | ICD-10-CM | POA: Diagnosis not present

## 2020-05-08 DIAGNOSIS — T7840XA Allergy, unspecified, initial encounter: Secondary | ICD-10-CM

## 2020-05-08 DIAGNOSIS — R061 Stridor: Secondary | ICD-10-CM | POA: Diagnosis not present

## 2020-05-08 DIAGNOSIS — R220 Localized swelling, mass and lump, head: Secondary | ICD-10-CM | POA: Diagnosis not present

## 2020-05-08 DIAGNOSIS — L299 Pruritus, unspecified: Secondary | ICD-10-CM | POA: Diagnosis not present

## 2020-05-08 MED ORDER — PREDNISONE 20 MG PO TABS
ORAL_TABLET | ORAL | 0 refills | Status: DC
Start: 2020-05-08 — End: 2022-07-20

## 2020-05-08 MED ORDER — SODIUM CHLORIDE 0.9 % IV BOLUS
500.0000 mL | Freq: Once | INTRAVENOUS | Status: AC
  Administered 2020-05-08: 500 mL via INTRAVENOUS

## 2020-05-08 MED ORDER — FAMOTIDINE IN NACL 20-0.9 MG/50ML-% IV SOLN
20.0000 mg | Freq: Once | INTRAVENOUS | Status: AC
  Administered 2020-05-08: 20 mg via INTRAVENOUS
  Filled 2020-05-08: qty 50

## 2020-05-08 MED ORDER — METHYLPREDNISOLONE SODIUM SUCC 125 MG IJ SOLR
125.0000 mg | Freq: Once | INTRAMUSCULAR | Status: AC
  Administered 2020-05-08: 125 mg via INTRAVENOUS
  Filled 2020-05-08: qty 2

## 2020-05-08 MED ORDER — FAMOTIDINE 20 MG PO TABS
20.0000 mg | ORAL_TABLET | Freq: Two times a day (BID) | ORAL | 0 refills | Status: DC
Start: 2020-05-08 — End: 2023-04-09

## 2020-05-08 NOTE — Discharge Instructions (Addendum)
Follow up with your md next week for recheck.  Take benadryl for itching and swelling

## 2020-05-08 NOTE — ED Triage Notes (Signed)
Pt sts drinking a smoothie approx 1 hour with kale and other healthy ingredients. Lips began swelling, itching, voice became hoarse and throat was scratchy. EMS administered 50mg  benadryl, 0.3 epi shot, 4mg  zofran. 18g Lac

## 2020-05-08 NOTE — ED Provider Notes (Signed)
[place] DEPT Provider Note   CSN: [id number] Arrival date & time: 05/08/20  1929     History Chief Complaint  Patient presents with  . Allergic Reaction    Michelle Duarte is a 39 y.o. female.  Patient states that she started have some swelling to her lip and felt like she could not swallow well.  Patient had just gone through eating a smoothie with different foods in it.  She has never had allergic reaction before  The history is provided by the patient and medical records. No language interpreter was used.  Allergic Reaction Presenting symptoms: itching   Presenting symptoms: no difficulty breathing and no rash   Severity:  Moderate Prior allergic episodes:  No prior episodes Context: food   Context: not animal exposure   Relieved by:  Nothing Worsened by:  Nothing Ineffective treatments:  None tried      Past Medical History:  Diagnosis Date  . Abnormal Pap smear 2011   ASCUS  . Complication of anesthesia   . Depression    Post partum  . Hx of bilateral breast reduction surgery 2007  . Hypertension   . Hypokalemia   . Insomnia   . PONV (postoperative nausea and vomiting)    nausea  . Vaginal Pap smear, abnormal     Patient Active Problem List   Diagnosis Date Noted  . Nonspecific abnormal electrocardiogram (ECG) (EKG) 10/04/2019  . Preop cardiovascular exam 10/04/2019  . Chronic hypertension in pregnancy 12/09/2015  . Left hip pain 02/21/2015  . BP (high blood pressure) 01/17/2015  . Vaginal delivery 08/06/2011  . CONSTIPATION 01/19/2008    Past Surgical History:  Procedure Laterality Date  . BREAST SURGERY     reduction  . DILATION AND CURETTAGE OF UTERUS     with polypectomy  . LAPAROSCOPIC GASTRIC SLEEVE RESECTION    . MASS EXCISION Right 05/15/2016   Procedure: REMOVAL OF RIGHT ARM MASS;  Surgeon: Jackolyn Confer, MD;  Location: [place];  Service: General;  Laterality: Right;  . THERAPEUTIC ABORTION  2000  .  TUBAL LIGATION  02/01/2016  . WISDOM TOOTH EXTRACTION       OB History    Gravida  4   Para  2   Term  2   Preterm      AB  2   Living  2     SAB  1   TAB  1   Ectopic      Multiple  0   Live Births  2           Family History  Problem Relation Age of Onset  . Hypertension Mother   . Drug abuse Mother        recovering addict  . Stroke Mother   . Heart disease Mother   . Hypertension Maternal Aunt   . Diabetes Paternal Aunt   . Hypertension Maternal Grandmother   . Diabetes Paternal Grandmother   . Hypertension Brother     Social History   Tobacco Use  . Smoking status: Former Smoker    Years: 8.00    Types: Cigars    Quit date: 08/2019    Years since quitting: 0.7  . Smokeless tobacco: Never Used  . Tobacco comment: 1 black and MIld a day  Vaping Use  . Vaping Use: Never used  Substance Use Topics  . Alcohol use: No    Alcohol/week: 0.0 standard drinks  . Drug use: No  Home Medications Prior to Admission medications   Medication Sig Start Date End Date Taking? Authorizing Provider  amLODipine (NORVASC) 5 MG tablet Take 1 tablet by mouth daily. 03/02/16  Yes [provider]  BIOTIN PO Take 2 tablets by mouth daily.    Yes [provider]  Multiple Vitamin (MULTIVITAMIN) tablet Take 1 tablet by mouth daily.    Yes [provider]  omeprazole (PRILOSEC) 40 MG capsule Take by mouth. 01/16/20  Yes [provider]  ursodiol (ACTIGALL) 300 MG capsule Take 300 mg by mouth 2 (two) times daily. 04/30/20  Yes [provider]  famotidine (PEPCID) 20 MG tablet Take 1 tablet (20 mg total) by mouth 2 (two) times daily. 05/08/20   Milton Ferguson, MD  irbesartan (AVAPRO) 300 MG tablet Take 300 mg by mouth daily. Patient not taking: Reported on 05/08/2020    [provider]  potassium chloride (K-DUR) 10 MEQ tablet Take 4 tablets (40 mEq total) by mouth daily. Patient not taking: Reported on 05/08/2020 05/15/16    Jackolyn Confer, MD  predniSONE (DELTASONE) 20 MG tablet 2 tabs po daily x 3 days 05/08/20   Milton Ferguson, MD    Allergies    Benadryl [diphenhydramine hcl]  Review of Systems   Review of Systems  Constitutional: Negative for appetite change and fatigue.  HENT: Negative for congestion, ear discharge and sinus pressure.        Swelling to lip  Eyes: Negative for discharge.  Respiratory: Negative for cough.   Cardiovascular: Negative for chest pain.  Gastrointestinal: Negative for abdominal pain and diarrhea.  Genitourinary: Negative for frequency and hematuria.  Musculoskeletal: Negative for back pain.  Skin: Positive for itching. Negative for rash.  Neurological: Negative for seizures and headaches.  Psychiatric/Behavioral: Negative for hallucinations.    Physical Exam Updated Vital Signs BP 124/79   Pulse 65   Temp 98.3 F (36.8 C) (Oral)   Resp 16   Ht 5\' 2"  (1.575 m)   Wt 98 kg   SpO2 96%   BMI 39.51 kg/m   Physical Exam Vitals reviewed.  Constitutional:      Appearance: She is well-developed.  HENT:     Head: Normocephalic.     Comments: Mild swelling to lip    Nose: Nose normal.  Eyes:     General: No scleral icterus.    Conjunctiva/sclera: Conjunctivae normal.  Neck:     Thyroid: No thyromegaly.  Cardiovascular:     Rate and Rhythm: Normal rate and regular rhythm.     Heart sounds: No murmur heard.  No friction rub. No gallop.   Pulmonary:     Breath sounds: No stridor. No wheezing or rales.  Chest:     Chest wall: No tenderness.  Abdominal:     General: There is no distension.     Tenderness: There is no abdominal tenderness. There is no rebound.  Musculoskeletal:        General: Normal range of motion.     Cervical back: Neck supple.  Lymphadenopathy:     Cervical: No cervical adenopathy.  Skin:    Findings: No erythema or rash.  Neurological:     Mental Status: She is alert and oriented to person, place, and time.     Motor: No abnormal  muscle tone.     Coordination: Coordination normal.  Psychiatric:        Behavior: Behavior normal.     ED Results / Procedures / Treatments   Labs (all  labs ordered are listed, but only abnormal results are displayed) Labs Reviewed - No data to display  EKG None  Radiology No results found.  Procedures Procedures (including critical care time)  Medications Ordered in ED Medications  methylPREDNISolone sodium succinate (SOLU-MEDROL) 125 mg/2 mL injection 125 mg (125 mg Intravenous Given 05/08/20 2013)  famotidine (PEPCID) IVPB 20 mg premix (0 mg Intravenous Stopped 05/08/20 2054)  sodium chloride 0.9 % bolus 500 mL (0 mLs Intravenous Stopped 05/08/20 2053)    ED Course  I have reviewed the triage vital signs and the nursing notes.  Pertinent labs & imaging results that were available during my care of the patient were reviewed by me and considered in my medical decision making (see chart for details).    MDM Rules/Calculators/A&P                          Patient with allergic reaction that has done well with steroids Benadryl Pepcid and epinephrine.  She will be sent home with steroids and Pepcid and will follow up with her PCP Final Clinical Impression(s) / ED Diagnoses Final diagnoses:  Allergic reaction, initial encounter    Rx / DC Orders ED Discharge Orders         Ordered    famotidine (PEPCID) 20 MG tablet  2 times daily     Discontinue  Reprint     05/08/20 2146    predniSONE (DELTASONE) 20 MG tablet     Discontinue  Reprint     05/08/20 2146           Milton Ferguson, MD 05/08/20 2152

## 2020-05-08 NOTE — ED Notes (Signed)
Pt requesting to leave at this time. Spoke with MD Zammit and encouraged pt to stay for at least 1 more hour to be monitored. Pt agreed to stay. Pt given sandwich, applesauce, water, and warm blankets at this time.

## 2020-07-18 DIAGNOSIS — I10 Essential (primary) hypertension: Secondary | ICD-10-CM | POA: Diagnosis not present

## 2020-07-18 DIAGNOSIS — E261 Secondary hyperaldosteronism: Secondary | ICD-10-CM | POA: Diagnosis not present

## 2020-10-10 DIAGNOSIS — D509 Iron deficiency anemia, unspecified: Secondary | ICD-10-CM | POA: Insufficient documentation

## 2021-08-28 ENCOUNTER — Emergency Department (HOSPITAL_BASED_OUTPATIENT_CLINIC_OR_DEPARTMENT_OTHER)
Admission: EM | Admit: 2021-08-28 | Discharge: 2021-08-28 | Disposition: A | Discharge status: Home / Self Care | Payer: Self-pay | Attending: Emergency Medicine | Admitting: Emergency Medicine

## 2021-08-28 ENCOUNTER — Encounter (HOSPITAL_BASED_OUTPATIENT_CLINIC_OR_DEPARTMENT_OTHER): Payer: Self-pay | Admitting: Emergency Medicine

## 2021-08-28 ENCOUNTER — Emergency Department (HOSPITAL_BASED_OUTPATIENT_CLINIC_OR_DEPARTMENT_OTHER): Payer: Self-pay | Admitting: Radiology

## 2021-08-28 ENCOUNTER — Other Ambulatory Visit: Payer: Self-pay

## 2021-08-28 DIAGNOSIS — M7989 Other specified soft tissue disorders: Secondary | ICD-10-CM

## 2021-08-28 DIAGNOSIS — R600 Localized edema: Secondary | ICD-10-CM | POA: Insufficient documentation

## 2021-08-28 DIAGNOSIS — Z87891 Personal history of nicotine dependence: Secondary | ICD-10-CM | POA: Insufficient documentation

## 2021-08-28 DIAGNOSIS — I10 Essential (primary) hypertension: Secondary | ICD-10-CM | POA: Insufficient documentation

## 2021-08-28 DIAGNOSIS — D649 Anemia, unspecified: Secondary | ICD-10-CM | POA: Insufficient documentation

## 2021-08-28 DIAGNOSIS — R0609 Other forms of dyspnea: Secondary | ICD-10-CM | POA: Insufficient documentation

## 2021-08-28 DIAGNOSIS — E876 Hypokalemia: Secondary | ICD-10-CM | POA: Insufficient documentation

## 2021-08-28 DIAGNOSIS — Z79899 Other long term (current) drug therapy: Secondary | ICD-10-CM | POA: Insufficient documentation

## 2021-08-28 LAB — COMPREHENSIVE METABOLIC PANEL
ALT: 30 U/L (ref 0–44)
AST: 26 U/L (ref 15–41)
Albumin: 3.3 g/dL — ABNORMAL LOW (ref 3.5–5.0)
Alkaline Phosphatase: 49 U/L (ref 38–126)
Anion gap: 6 (ref 5–15)
BUN: 14 mg/dL (ref 6–20)
CO2: 31 mmol/L (ref 22–32)
Calcium: 8.6 mg/dL — ABNORMAL LOW (ref 8.9–10.3)
Chloride: 101 mmol/L (ref 98–111)
Creatinine, Ser: 0.77 mg/dL (ref 0.44–1.00)
GFR, Estimated: >60 mL/min (ref >60)
Glucose, Bld: 88 mg/dL (ref 70–99)
Potassium: 3.3 mmol/L — ABNORMAL LOW (ref 3.5–5.1)
Sodium: 138 mmol/L (ref 135–145)
Total Bilirubin: 0.3 mg/dL (ref 0.3–1.2)
Total Protein: 6.1 g/dL — ABNORMAL LOW (ref 6.5–8.1)

## 2021-08-28 LAB — CBC WITH DIFFERENTIAL/PLATELET
Abs Immature Granulocytes: 0.03 10*3/uL (ref 0.00–0.07)
Basophils Absolute: 0.1 10*3/uL (ref 0.0–0.1)
Basophils Relative: 1 %
Eosinophils Absolute: 0.3 10*3/uL (ref 0.0–0.5)
Eosinophils Relative: 4 %
HCT: 30.3 % — ABNORMAL LOW (ref 36.0–46.0)
Hemoglobin: 9.6 g/dL — ABNORMAL LOW (ref 12.0–15.0)
Immature Granulocytes: 0 %
Lymphocytes Relative: 18 %
Lymphs Abs: 1.5 10*3/uL (ref 0.7–4.0)
MCH: 26.8 pg (ref 26.0–34.0)
MCHC: 31.7 g/dL (ref 30.0–36.0)
MCV: 84.6 fL (ref 80.0–100.0)
Monocytes Absolute: 0.7 10*3/uL (ref 0.1–1.0)
Monocytes Relative: 9 %
Neutro Abs: 5.8 10*3/uL (ref 1.7–7.7)
Neutrophils Relative %: 68 %
Platelets: 462 10*3/uL — ABNORMAL HIGH (ref 150–400)
RBC: 3.58 MIL/uL — ABNORMAL LOW (ref 3.87–5.11)
RDW: 19 % — ABNORMAL HIGH (ref 11.5–15.5)
WBC: 8.4 10*3/uL (ref 4.0–10.5)
nRBC: 0 % (ref 0.0–0.2)

## 2021-08-28 LAB — URINALYSIS, ROUTINE W REFLEX MICROSCOPIC
Bilirubin Urine: NEGATIVE
Glucose, UA: NEGATIVE mg/dL
Hgb urine dipstick: NEGATIVE
Ketones, ur: NEGATIVE mg/dL
Leukocytes,Ua: NEGATIVE
Nitrite: NEGATIVE
Protein, ur: NEGATIVE mg/dL
Specific Gravity, Urine: 1.017 (ref 1.005–1.030)
pH: 7 (ref 5.0–8.0)

## 2021-08-28 LAB — BRAIN NATRIURETIC PEPTIDE: B Natriuretic Peptide: 20.7 pg/mL (ref 0.0–100.0)

## 2021-08-28 MED ORDER — FUROSEMIDE 40 MG PO TABS
40.0000 mg | ORAL_TABLET | Freq: Every day | ORAL | 0 refills | Status: DC
Start: 2021-08-28 — End: 2023-04-09

## 2021-08-28 MED ORDER — POTASSIUM CHLORIDE CRYS ER 20 MEQ PO TBCR
20.0000 meq | EXTENDED_RELEASE_TABLET | Freq: Two times a day (BID) | ORAL | 0 refills | Status: DC
Start: 2021-08-28 — End: 2023-05-24

## 2021-08-28 NOTE — ED Provider Notes (Signed)
[facility] EMERGENCY DEPT Provider Note   CSN: [id number] Arrival date & time: 08/28/21  1719     History Chief Complaint  Patient presents with   Leg Swelling    Michelle Duarte is a 40 y.o. female who presents with bilateral leg swelling for 1 week.  Patient recently had abdominal surgery on 10/4.  She notes that she has nonpainful swelling in both legs from her toes up to her hips.  She also notes that she has had increasing swelling in her abdomen, was unsure if this was related to her procedure or not.  She contacted her primary care provider who recommended a short course of Lasix, she took 3 days of this.  She had no improvement in symptoms.  She states that her legs are painful at the end of the day due to swelling.  She is on been trying anything for pain.  She is also endorsing orthopnea and dyspnea on exertion.  No fevers, chills, chest pain, abdominal pain.  HPI     Past Medical History:  Diagnosis Date   Abnormal Pap smear 7829   ASCUS   Complication of anesthesia    Depression    Post partum   Hx of bilateral breast reduction surgery 2007   Hypertension    Hypokalemia    Insomnia    PONV (postoperative nausea and vomiting)    nausea   Vaginal Pap smear, abnormal     Patient Active Problem List   Diagnosis Date Noted   Nonspecific abnormal electrocardiogram (ECG) (EKG) 10/04/2019   Preop cardiovascular exam 10/04/2019   Chronic hypertension in pregnancy 12/09/2015   Left hip pain 02/21/2015   BP (high blood pressure) 01/17/2015   Vaginal delivery 08/06/2011   CONSTIPATION 01/19/2008    Past Surgical History:  Procedure Laterality Date   BREAST SURGERY     reduction   DILATION AND CURETTAGE OF UTERUS     with polypectomy   LAPAROSCOPIC GASTRIC SLEEVE RESECTION     MASS EXCISION Right 05/15/2016   Procedure: REMOVAL OF RIGHT ARM MASS;  Surgeon: Jackolyn Confer, MD;  Location: [facility];  Service: General;  Laterality: Right;   THERAPEUTIC  ABORTION  2000   TUBAL LIGATION  02/01/2016   WISDOM TOOTH EXTRACTION       OB History     Gravida  4   Para  2   Term  2   Preterm      AB  2   Living  2      SAB  1   IAB  1   Ectopic      Multiple  0   Live Births  2           Family History  Problem Relation Age of Onset   Hypertension Mother    Drug abuse Mother        recovering addict   Stroke Mother    Heart disease Mother    Hypertension Maternal Aunt    Diabetes Paternal Aunt    Hypertension Maternal Grandmother    Diabetes Paternal Grandmother    Hypertension Brother     Social History   Tobacco Use   Smoking status: Former    Types: Cigars    Quit date: 08/2019    Years since quitting: 2.0   Smokeless tobacco: Never   Tobacco comments:    1 black and MIld a day  Vaping Use   Vaping Use: Never used  Substance Use Topics  Alcohol use: No    Alcohol/week: 0.0 standard drinks   Drug use: No    Home Medications Prior to Admission medications   Medication Sig Start Date End Date Taking? Authorizing Provider  furosemide (LASIX) 40 MG tablet Take 1 tablet (40 mg total) by mouth daily. 08/28/21  Yes Adden Strout T, PA-C  potassium chloride SA (KLOR-CON) 20 MEQ tablet Take 1 tablet (20 mEq total) by mouth 2 (two) times daily for 3 days. 08/28/21 08/31/21 Yes Caty Tessler T, PA-C  amLODipine (NORVASC) 5 MG tablet Take 1 tablet by mouth daily. 03/02/16   [provider]  BIOTIN PO Take 2 tablets by mouth daily.     [provider]  famotidine (PEPCID) 20 MG tablet Take 1 tablet (20 mg total) by mouth 2 (two) times daily. 05/08/20   Milton Ferguson, MD  irbesartan (AVAPRO) 300 MG tablet Take 300 mg by mouth daily. Patient not taking: Reported on 05/08/2020    [provider]  Multiple Vitamin (MULTIVITAMIN) tablet Take 1 tablet by mouth daily.     [provider]  omeprazole (PRILOSEC) 40 MG capsule Take by mouth. 01/16/20   [provider]   potassium chloride (K-DUR) 10 MEQ tablet Take 4 tablets (40 mEq total) by mouth daily. Patient not taking: Reported on 05/08/2020 05/15/16   Jackolyn Confer, MD  predniSONE (DELTASONE) 20 MG tablet 2 tabs po daily x 3 days 05/08/20   Milton Ferguson, MD  ursodiol (ACTIGALL) 300 MG capsule Take 300 mg by mouth 2 (two) times daily. 04/30/20   [provider]    Allergies    Benadryl [diphenhydramine hcl]  Review of Systems   Review of Systems  Constitutional:  Negative for chills and fever.  Respiratory:  Positive for shortness of breath.   Cardiovascular:  Positive for leg swelling. Negative for chest pain.  Gastrointestinal:  Positive for abdominal distention. Negative for constipation, diarrhea, nausea and vomiting.  Genitourinary:  Negative for dysuria, flank pain, frequency and urgency.  All other systems reviewed and are negative.  Physical Exam Updated Vital Signs BP 128/78 (BP Location: Right Arm)   Pulse 80   Temp 99.3 F (37.4 C) (Oral)   Resp 16   Ht 5' 3.75" (1.619 m)   Wt 93 kg   LMP 08/20/2021   SpO2 100%   BMI 35.46 kg/m   Physical Exam Vitals and nursing note reviewed.  Constitutional:      Appearance: Normal appearance.  HENT:     Head: Normocephalic and atraumatic.  Eyes:     Conjunctiva/sclera: Conjunctivae normal.  Cardiovascular:     Rate and Rhythm: Normal rate and regular rhythm.  Pulmonary:     Effort: Pulmonary effort is normal. No respiratory distress.     Breath sounds: Normal breath sounds.  Abdominal:     General: A surgical scar is present. There is distension.     Palpations: Abdomen is soft.     Tenderness: There is no abdominal tenderness. There is no guarding or rebound.  Musculoskeletal:     Right lower leg: 1+ Pitting Edema present.     Left lower leg: 1+ Pitting Edema present.  Skin:    General: Skin is warm and dry.  Neurological:     General: No focal deficit present.     Mental Status: She is alert.    ED Results /  Procedures / Treatments   Labs (all labs ordered are listed, but only abnormal results are displayed) Labs Reviewed  CBC WITH DIFFERENTIAL/PLATELET - Abnormal; Notable for the following components:      Result Value   RBC 3.58 (*)    Hemoglobin 9.6 (*)    HCT 30.3 (*)    RDW 19.0 (*)    Platelets 462 (*)    All other components within normal limits  COMPREHENSIVE METABOLIC PANEL - Abnormal; Notable for the following components:   Potassium 3.3 (*)    Calcium 8.6 (*)    Total Protein 6.1 (*)    Albumin 3.3 (*)    All other components within normal limits  URINALYSIS, ROUTINE W REFLEX MICROSCOPIC - Abnormal; Notable for the following components:   APPearance HAZY (*)    All other components within normal limits  BRAIN NATRIURETIC PEPTIDE    EKG None  Radiology DG Chest 2 View  Result Date: 08/28/2021 CLINICAL DATA:  Dyspnea on exertion EXAM: CHEST - 2 VIEW COMPARISON:  02/06/2016 FINDINGS: The heart size and mediastinal contours are within normal limits. Both lungs are clear. The visualized skeletal structures are unremarkable. IMPRESSION: No active cardiopulmonary disease. Electronically Signed   By: Merilyn Baba M.D.   On: 08/28/2021 22:02    Procedures Procedures   Medications Ordered in ED Medications - No data to display  ED Course  I have reviewed the triage vital signs and the nursing notes.  Pertinent labs & imaging results that were available during my care of the patient were reviewed by me and considered in my medical decision making (see chart for details).    MDM Rules/Calculators/A&P                           Patient is 40 y/o female who presents with bilateral leg swelling x1 week.  Patient recently had abdominal surgery on 10/4.  She has tried approximately 3 days of Lasix 20 mg prescribed by her PCP, without relief.  She also states that she has had dyspnea on exertion and orthopnea.  On exam patient has 1+ bilateral pitting edema.  Abdomen is  slightly distended, there is no tenderness, guarding or rigidity.  Lung sounds are clear to auscultation bilaterally.  Lab work benign.  Hemoglobin 9.6 improved from postop, patient currently on iron replacement.  Urinalysis negative for UTI, negative for protein.  Low suspicion for acute nephrotic syndrome.  Chest x-ray negative for pleural effusions.  BNP 20.7.  Potassium of 3.3, will treat with oral replacement.  Patient is hemodynamically stable not requiring admission or inpatient treatment for symptoms at this time.  Plan to discharge home with prescription for Lasix and PCP follow-up in a week to repeat labs.  Providing referral to cardiology for outpatient echocardiogram.  Discussed reasons to return to the emergency department.  Patient agreeable to plan.  I discussed this case with my attending physician Dr. Armandina Gemma who cosigned this note including patient's presenting symptoms, physical exam, and planned diagnostics and interventions. Attending physician stated agreement with plan or made changes to plan which were implemented.    Final Clinical Impression(s) / ED Diagnoses Final diagnoses:  Leg swelling  Dyspnea on exertion    Rx / DC Orders ED Discharge Orders          Ordered    Ambulatory referral to Cardiology        08/28/21 2256    furosemide (LASIX) 40 MG tablet  Daily        08/28/21 2256    potassium chloride SA (KLOR-CON) 20 MEQ  tablet  2 times daily        08/28/21 2256             Rajanae Mantia T, PA-C 08/28/21 2317    Regan Lemming, MD 08/28/21 [phone]

## 2021-08-28 NOTE — ED Notes (Signed)
Patient transported to X-ray 

## 2021-08-28 NOTE — ED Triage Notes (Signed)
Reports bil lower ext swelling for about a week.  Hx of BBL, tummy tuck and lipo on October 4th.

## 2021-08-28 NOTE — Discharge Instructions (Addendum)
You were seen in the emergency department today for leg swelling.   Your lab work today was reassuring. I am prescribing you lasix 40 mg tablets to take daily for the swelling. Your potassium was also slightly low, I am prescribing you oral replacement. I would also continue your iron replacement.   I've put in a referral to cardiology and they should be calling you soon. If you have a cardiologist you prefer, you can follow up with them.  I would make an appointment with your PCP in the next week to check your labs.   Continue to monitor how you're doing and return to the ER for new or worsening symptoms such as chest pain, fevers, worsening difficulty breathing.   It has been a pleasure seeing and caring for you today and I hope you start feeling better soon!

## 2021-08-28 NOTE — ED Notes (Signed)
This RN presented the AVS utilizing Teachback Method. Patient verbalizes understanding of Discharge Instructions. Opportunity for Questioning and Answers were provided. Patient Discharged from ED ambulatory to Home via SELF.

## 2021-10-14 NOTE — Progress Notes (Incomplete)
Cardiology Office Note:    Date:  10/14/2021   ID:  Michelle Duarte, DOB [birth year], MRN [id number]  PCP:  Merrilee Seashore, MD  Cardiologist:  None    Referring MD: Kateri Plummer, PA-C   No chief complaint on file.   History of Present Illness:    Michelle Duarte is a 40 y.o. female with a hx of ***  Past Medical History:  Diagnosis Date   Abnormal Pap smear 4656   ASCUS   Complication of anesthesia    Depression    Post partum   Hx of bilateral breast reduction surgery 2007   Hypertension    Hypokalemia    Insomnia    PONV (postoperative nausea and vomiting)    nausea   Vaginal Pap smear, abnormal     Past Surgical History:  Procedure Laterality Date   BREAST SURGERY     reduction   DILATION AND CURETTAGE OF UTERUS     with polypectomy   LAPAROSCOPIC GASTRIC SLEEVE RESECTION     MASS EXCISION Right 05/15/2016   Procedure: REMOVAL OF RIGHT ARM MASS;  Surgeon: Jackolyn Confer, MD;  Location: [facility];  Service: General;  Laterality: Right;   THERAPEUTIC ABORTION  2000   TUBAL LIGATION  02/01/2016   WISDOM TOOTH EXTRACTION      Current Medications: No outpatient medications have been marked as taking for the 10/15/21 encounter (Appointment) with Skeet Latch, MD.     Allergies:   Benadryl [diphenhydramine hcl]   Social History   Socioeconomic History   Marital status: Married    Spouse name: Not on file   Number of children: 2   Years of education: Not on file   Highest education level: Not on file  Occupational History   Not on file  Tobacco Use   Smoking status: Former    Types: Cigars    Quit date: 08/2019    Years since quitting: 2.1   Smokeless tobacco: Never   Tobacco comments:    1 black and MIld a day  Vaping Use   Vaping Use: Never used  Substance and Sexual Activity   Alcohol use: No    Alcohol/week: 0.0 standard drinks   Drug use: No   Sexual activity: Yes  Other Topics Concern   Not on file  Social History Narrative   Not  on file   Social Determinants of Health   Financial Resource Strain: Not on file  Food Insecurity: Not on file  Transportation Needs: Not on file  Physical Activity: Not on file  Stress: Not on file  Social Connections: Not on file     Family History: The patient's ***family history includes Diabetes in her paternal aunt and paternal grandmother; Drug abuse in her mother; Heart disease in her mother; Hypertension in her brother, maternal aunt, maternal grandmother, and mother; Stroke in her mother.  ROS:   Please see the history of present illness.    ROS  All other systems reviewed and negative.   EKGs/Labs/Other Studies Reviewed:    The following studies were reviewed today: ***  EKG:  EKG is *** ordered today.  The ekg ordered today demonstrates ***  Recent Labs: 08/28/2021: ALT 30; B Natriuretic Peptide 20.7; BUN 14; Creatinine, Ser 0.77; Hemoglobin 9.6; Platelets 462; Potassium 3.3; Sodium 138   Recent Lipid Panel No results found for: CHOL, TRIG, HDL, CHOLHDL, VLDL, LDLCALC, LDLDIRECT  CHA2DS2-VASc Score =   [ ] .  Therefore, the patient's annual risk of stroke  is   %.        Physical Exam:    VS:  There were no vitals taken for this visit.    Wt Readings from Last 3 Encounters:  08/28/21 205 lb (93 kg)  05/08/20 216 lb (98 kg)  10/04/19 262 lb (118.8 kg)     GEN: *** Well nourished, well developed in no acute distress HEENT: Normal NECK: No JVD; No carotid bruits LYMPHATICS: No lymphadenopathy CARDIAC: ***RRR, no murmurs, rubs, gallops RESPIRATORY:  Clear to auscultation without rales, wheezing or rhonchi  ABDOMEN: Soft, non-tender, non-distended MUSCULOSKELETAL:  No edema; No deformity  SKIN: Warm and dry NEUROLOGIC:  Alert and oriented x 3 PSYCHIATRIC:  Normal affect   ASSESSMENT:    No diagnosis found. PLAN:    In order of problems listed above:  .[facility]   Time Spent: *** minutes total time of encounter, including *** minutes spent in  face-to-face patient care on the date of this encounter. This time includes coordination of care and counseling regarding above mentioned problem list. Remainder of non-face-to-face time involved reviewing chart documents/testing relevant to the patient encounter and documentation in the medical record. I have independently reviewed documentation from referring provider  Medication Adjustments/Labs and Tests Ordered: Current medicines are reviewed at length with the patient today.  Concerns regarding medicines are outlined above.  No orders of the defined types were placed in this encounter.  No orders of the defined types were placed in this encounter.   Mosie Lukes  10/14/2021 9:20 AM    [name]

## 2021-10-15 ENCOUNTER — Ambulatory Visit (HOSPITAL_BASED_OUTPATIENT_CLINIC_OR_DEPARTMENT_OTHER): Payer: Self-pay | Admitting: Cardiovascular Disease

## 2021-11-11 ENCOUNTER — Ambulatory Visit (INDEPENDENT_AMBULATORY_CARE_PROVIDER_SITE_OTHER): Payer: PRIVATE HEALTH INSURANCE | Admitting: Sports Medicine

## 2021-11-11 ENCOUNTER — Ambulatory Visit (INDEPENDENT_AMBULATORY_CARE_PROVIDER_SITE_OTHER): Payer: PRIVATE HEALTH INSURANCE

## 2021-11-11 ENCOUNTER — Other Ambulatory Visit: Payer: Self-pay

## 2021-11-11 VITALS — BP 130/80 | HR 65 | Ht 63.0 in | Wt 189.0 lb

## 2021-11-11 DIAGNOSIS — M7582 Other shoulder lesions, left shoulder: Secondary | ICD-10-CM

## 2021-11-11 DIAGNOSIS — M25512 Pain in left shoulder: Secondary | ICD-10-CM

## 2021-11-11 NOTE — Progress Notes (Signed)
Benito Mccreedy D.Ali Molina [place] Phone: [phone]   Assessment and Plan:     1. Acute pain of left shoulder 2. Rotator cuff tendonitis, left -Acute, uncomplicated, initial sports medicine visit - Likely strain of rotator cuff based on HPI, physical exam, unremarkable x-ray - X-ray obtained in clinic.  My interpretation: No acute fracture or dislocation.  Unremarkable x-ray - Patient elected for CSI.  Tolerated well per note below - Start HEP - May use Tylenol/NSAIDs as needed for breakthrough pain  Procedure: Subacromial Injection Side: Left  Risks explained and consent was given verbally. The site was cleaned with alcohol prep. A steroid injection was performed from posterior approach using 49mL of 1% lidocaine without epinephrine and 32mL of kenalog 40mg /ml. This was well tolerated and resulted in symptomatic relief.  Needle was removed, hemostasis achieved, and post injection instructions were explained.   Pt was advised to call or return to clinic if these symptoms worsen or fail to improve as anticipated.  Pertinent previous records reviewed include none   Follow Up: As needed in 2 to 3 weeks if no improvement or worsening of symptoms.  At that point could consider ultrasound versus formal PT versus advanced imaging   Subjective:   I, Pincus Badder, am serving as a Education administrator for Doctor Glennon Mac  Chief Complaint: left shoulder pain  HPI:   11/11/21 Patient is a 41 year old female complaining of left shoulder pain . Patient states that she has diagnosed her self with bursitis sleeps on her shoulders about a week ago she woke up in pain no MOI , rubbed pain cream on heat pack and Advil, Tylenol and that didn't help borrowed muscle relaxer from mom ROM is decreased feels like pull and then a deep ache in the shoulder joint its self radiating pain from upper trap to about mid arm no numbness just  throbbing  pain    Relevant Historical Information: Hypertension  Additional pertinent review of systems negative.   Current Outpatient Medications:    amLODipine (NORVASC) 5 MG tablet, Take 1 tablet by mouth daily., Disp: , Rfl: 5   BIOTIN PO, Take 2 tablets by mouth daily. , Disp: , Rfl:    famotidine (PEPCID) 20 MG tablet, Take 1 tablet (20 mg total) by mouth 2 (two) times daily., Disp: 10 tablet, Rfl: 0   furosemide (LASIX) 40 MG tablet, Take 1 tablet (40 mg total) by mouth daily., Disp: 14 tablet, Rfl: 0   irbesartan (AVAPRO) 300 MG tablet, Take 300 mg by mouth daily. (Patient not taking: Reported on 05/08/2020), Disp: , Rfl:    Multiple Vitamin (MULTIVITAMIN) tablet, Take 1 tablet by mouth daily. , Disp: , Rfl:    omeprazole (PRILOSEC) 40 MG capsule, Take by mouth., Disp: , Rfl:    potassium chloride (K-DUR) 10 MEQ tablet, Take 4 tablets (40 mEq total) by mouth daily. (Patient not taking: Reported on 05/08/2020), Disp: 2 tablet, Rfl: 0   potassium chloride SA (KLOR-CON) 20 MEQ tablet, Take 1 tablet (20 mEq total) by mouth 2 (two) times daily for 3 days., Disp: 6 tablet, Rfl: 0   predniSONE (DELTASONE) 20 MG tablet, 2 tabs po daily x 3 days, Disp: 6 tablet, Rfl: 0   ursodiol (ACTIGALL) 300 MG capsule, Take 300 mg by mouth 2 (two) times daily., Disp: , Rfl:    Objective:     Vitals:   11/11/21 1442  BP: 130/80  Pulse: 65  SpO2: 98%  Weight: 189 lb (85.7 kg)  Height: 5\' 3"  (1.6 m)      Body mass index is 33.48 kg/m.    Physical Exam:    Gen: Appears well, nad, nontoxic and pleasant Neuro:sensation intact, strength is 5/5 with df/pf/inv/ev, muscle tone wnl Skin: no suspicious lesion or defmority Psych: A&O, appropriate mood and affect  Left shoulder: no deformity, swelling or muscle wasting No scapular winging FF 180, abd 180, int 0, ext 90 TTP AC, deltoid, trapezius NTTP over the [place], clavicle , coracoid, biceps groove, humerus,  cervical spine Positive neer, hawkings, empty can,   obriens, Negative crossarm, speeds, subscap lift off Neg ant drawer, sulcus sign, apprehension Negative Spurling's test bilat FROM of neck    Electronically signed by:  Benito Mccreedy D.[facility] 3:07 PM 11/11/21

## 2021-11-11 NOTE — Patient Instructions (Addendum)
Good to see you  HEP for shoulder  As needed follow up or 2-3 week follow up if no improvement

## 2021-12-09 ENCOUNTER — Encounter: Payer: Self-pay | Admitting: *Deleted

## 2022-01-22 ENCOUNTER — Encounter (HOSPITAL_BASED_OUTPATIENT_CLINIC_OR_DEPARTMENT_OTHER): Payer: Self-pay | Admitting: Emergency Medicine

## 2022-01-22 ENCOUNTER — Other Ambulatory Visit: Payer: Self-pay

## 2022-01-22 DIAGNOSIS — R079 Chest pain, unspecified: Secondary | ICD-10-CM | POA: Diagnosis not present

## 2022-01-22 DIAGNOSIS — R519 Headache, unspecified: Secondary | ICD-10-CM | POA: Diagnosis present

## 2022-01-22 DIAGNOSIS — Z5321 Procedure and treatment not carried out due to patient leaving prior to being seen by health care provider: Secondary | ICD-10-CM | POA: Diagnosis not present

## 2022-01-22 LAB — CBC
HCT: 40.9 % (ref 36.0–46.0)
Hemoglobin: 13.1 g/dL (ref 12.0–15.0)
MCH: 25.7 pg — ABNORMAL LOW (ref 26.0–34.0)
MCHC: 32 g/dL (ref 30.0–36.0)
MCV: 80.4 fL (ref 80.0–100.0)
Platelets: 252 10*3/uL (ref 150–400)
RBC: 5.09 MIL/uL (ref 3.87–5.11)
RDW: 15.7 % — ABNORMAL HIGH (ref 11.5–15.5)
WBC: 6.6 10*3/uL (ref 4.0–10.5)
nRBC: 0 % (ref 0.0–0.2)

## 2022-01-22 LAB — COMPREHENSIVE METABOLIC PANEL
ALT: 12 U/L (ref 0–44)
AST: 14 U/L — ABNORMAL LOW (ref 15–41)
Albumin: 4 g/dL (ref 3.5–5.0)
Alkaline Phosphatase: 72 U/L (ref 38–126)
Anion gap: 9 (ref 5–15)
BUN: 18 mg/dL (ref 6–20)
CO2: 26 mmol/L (ref 22–32)
Calcium: 9.2 mg/dL (ref 8.9–10.3)
Chloride: 105 mmol/L (ref 98–111)
Creatinine, Ser: 0.85 mg/dL (ref 0.44–1.00)
GFR, Estimated: >60 mL/min (ref >60)
Glucose, Bld: 95 mg/dL (ref 70–99)
Potassium: 3 mmol/L — ABNORMAL LOW (ref 3.5–5.1)
Sodium: 140 mmol/L (ref 135–145)
Total Bilirubin: 0.3 mg/dL (ref 0.3–1.2)
Total Protein: 7.2 g/dL (ref 6.5–8.1)

## 2022-01-22 LAB — TROPONIN I (HIGH SENSITIVITY): Troponin I (High Sensitivity): 4 ng/L (ref <18)

## 2022-01-22 NOTE — ED Triage Notes (Signed)
Pt. States last two weeks having headaches seeing black spots. Pt. States she ran out of her medication for BP and needs new prescription.  ?

## 2022-01-23 ENCOUNTER — Emergency Department (HOSPITAL_BASED_OUTPATIENT_CLINIC_OR_DEPARTMENT_OTHER)
Admission: EM | Admit: 2022-01-23 | Discharge: 2022-01-23 | Disposition: A | Discharge status: Home / Self Care | Payer: PRIVATE HEALTH INSURANCE | Attending: Emergency Medicine | Admitting: Emergency Medicine

## 2022-02-19 DIAGNOSIS — L732 Hidradenitis suppurativa: Secondary | ICD-10-CM | POA: Insufficient documentation

## 2022-04-20 ENCOUNTER — Ambulatory Visit
Admission: EM | Admit: 2022-04-20 | Discharge: 2022-04-20 | Discharge status: Absent without leave | Payer: PRIVATE HEALTH INSURANCE

## 2022-04-20 ENCOUNTER — Ambulatory Visit
Admission: EM | Admit: 2022-04-20 | Discharge: 2022-04-20 | Disposition: A | Discharge status: Home / Self Care | Payer: PRIVATE HEALTH INSURANCE

## 2022-04-20 ENCOUNTER — Ambulatory Visit
Admission: EM | Admit: 2022-04-20 | Discharge: 2022-04-20 | Disposition: A | Discharge status: Home / Self Care | Payer: PRIVATE HEALTH INSURANCE | Attending: Internal Medicine | Admitting: Internal Medicine

## 2022-04-20 VITALS — BP 166/107 | HR 89 | Temp 98.1°F | Resp 20

## 2022-04-20 DIAGNOSIS — Z113 Encounter for screening for infections with a predominantly sexual mode of transmission: Secondary | ICD-10-CM

## 2022-04-20 DIAGNOSIS — N898 Other specified noninflammatory disorders of vagina: Secondary | ICD-10-CM | POA: Diagnosis present

## 2022-04-20 DIAGNOSIS — L739 Follicular disorder, unspecified: Secondary | ICD-10-CM

## 2022-04-20 MED ORDER — DOXYCYCLINE HYCLATE 100 MG PO CAPS: 100.0000 mg | ORAL_CAPSULE | Freq: Two times a day (BID) | ORAL | 0 refills | Status: DC

## 2022-04-20 NOTE — ED Provider Notes (Addendum)
[facility]    CSN: [id number] Arrival date & time: 04/20/22  1752      History   Chief Complaint Chief Complaint  Patient presents with   Vaginal Itching    STI check - Entered by patient    HPI Michelle Duarte is a 41 y.o. female.   Patient presents for STD testing.  Patient denies any known exposure but reports that she is suspicious that her husband has been unfaithful.  She reports that she does have vaginal itching.  Denies vaginal discharge, urinary burning, urinary frequency, abdominal pain, pelvic pain, fever, back pain, hematuria.  She also reports that she has "hydradenitis flareup" to right groin that has been present for a few days.  She reports itching around this area so is not sure if vaginal itching is related to this or possible STD.  Last menstrual cycle is unknown but patient reports that she has a tubal ligation so no concern for pregnancy.  Patient also has elevated blood pressure reading but reports that she has not taken her blood pressure medication today.  Denies any associated chest pain, shortness of breath, headache, blurred vision, nausea, vomiting, dizziness.   Vaginal Itching    Past Medical History:  Diagnosis Date   Abnormal Pap smear 4008   ASCUS   Complication of anesthesia    Depression    Post partum   Hx of bilateral breast reduction surgery 2007   Hypertension    Hypokalemia    Insomnia    PONV (postoperative nausea and vomiting)    nausea   Vaginal Pap smear, abnormal     Patient Active Problem List   Diagnosis Date Noted   Nonspecific abnormal electrocardiogram (ECG) (EKG) 10/04/2019   Preop cardiovascular exam 10/04/2019   Chronic hypertension in pregnancy 12/09/2015   Left hip pain 02/21/2015   BP (high blood pressure) 01/17/2015   Vaginal delivery 08/06/2011   CONSTIPATION 01/19/2008    Past Surgical History:  Procedure Laterality Date   BREAST SURGERY     reduction   DILATION AND CURETTAGE OF UTERUS      with polypectomy   LAPAROSCOPIC GASTRIC SLEEVE RESECTION     MASS EXCISION Right 05/15/2016   Procedure: REMOVAL OF RIGHT ARM MASS;  Surgeon: Jackolyn Confer, MD;  Location: [facility];  Service: General;  Laterality: Right;   THERAPEUTIC ABORTION  2000   TUBAL LIGATION  02/01/2016   WISDOM TOOTH EXTRACTION      OB History     Gravida  4   Para  2   Term  2   Preterm      AB  2   Living  2      SAB  1   IAB  1   Ectopic      Multiple  0   Live Births  2            Home Medications    Prior to Admission medications   Medication Sig Start Date End Date Taking? Authorizing Provider  doxycycline (VIBRAMYCIN) 100 MG capsule Take 1 capsule (100 mg total) by mouth 2 (two) times daily. 04/20/22  Yes Idonna Heeren, Hildred Alamin E, FNP  amLODipine (NORVASC) 5 MG tablet Take 1 tablet by mouth daily. 03/02/16   [provider]  BIOTIN PO Take 2 tablets by mouth daily.     [provider]  famotidine (PEPCID) 20 MG tablet Take 1 tablet (20 mg total) by mouth 2 (two) times daily. 05/08/20  Milton Ferguson, MD  furosemide (LASIX) 40 MG tablet Take 1 tablet (40 mg total) by mouth daily. 08/28/21   Roemhildt, Lorin T, PA-C  irbesartan (AVAPRO) 300 MG tablet Take 300 mg by mouth daily. Patient not taking: Reported on 05/08/2020    [provider]  Multiple Vitamin (MULTIVITAMIN) tablet Take 1 tablet by mouth daily.     [provider]  omeprazole (PRILOSEC) 40 MG capsule Take by mouth. 01/16/20   [provider]  potassium chloride (K-DUR) 10 MEQ tablet Take 4 tablets (40 mEq total) by mouth daily. Patient not taking: Reported on 05/08/2020 05/15/16   Jackolyn Confer, MD  potassium chloride SA (KLOR-CON) 20 MEQ tablet Take 1 tablet (20 mEq total) by mouth 2 (two) times daily for 3 days. 08/28/21 08/31/21  Roemhildt, Lorin T, PA-C  predniSONE (DELTASONE) 20 MG tablet 2 tabs po daily x 3 days 05/08/20   Milton Ferguson, MD  ursodiol (ACTIGALL) 300 MG capsule Take  300 mg by mouth 2 (two) times daily. 04/30/20   [provider]    Family History Family History  Problem Relation Age of Onset   Hypertension Mother    Drug abuse Mother        recovering addict   Stroke Mother    Heart disease Mother    Hypertension Maternal Aunt    Diabetes Paternal Aunt    Hypertension Maternal Grandmother    Diabetes Paternal Grandmother    Hypertension Brother     Social History Social History   Tobacco Use   Smoking status: Former    Types: Cigars    Quit date: 08/2019    Years since quitting: 2.6   Smokeless tobacco: Never   Tobacco comments:    1 black and MIld a day  Vaping Use   Vaping Use: Never used  Substance Use Topics   Alcohol use: No    Alcohol/week: 0.0 standard drinks of alcohol   Drug use: No     Allergies   Benadryl [diphenhydramine hcl]   Review of Systems Review of Systems Per HPI  Physical Exam Triage Vital Signs ED Triage Vitals [04/20/22 1810]  Enc Vitals Group     BP (!) 166/107     Pulse Rate 89     Resp 20     Temp 98.1 F (36.7 C)     Temp Source Oral     SpO2 99 %     Weight      Height      Head Circumference      Peak Flow      Pain Score      Pain Loc      Pain Edu?      Excl. in [place]?    No data found.  Updated Vital Signs BP (!) 166/107 (BP Location: Left Arm)   Pulse 89   Temp 98.1 F (36.7 C) (Oral)   Resp 20   SpO2 99%   Visual Acuity Right Eye Distance:   Left Eye Distance:   Bilateral Distance:    Right Eye Near:   Left Eye Near:    Bilateral Near:     Physical Exam Exam conducted with a chaperone present.  Constitutional:      General: She is not in acute distress.    Appearance: Normal appearance. She is not toxic-appearing or diaphoretic.  HENT:     Head: Normocephalic and atraumatic.  Eyes:     Extraocular Movements: Extraocular movements intact.  Conjunctiva/sclera: Conjunctivae normal.  Cardiovascular:     Rate and Rhythm: Normal rate and regular  rhythm.     Pulses: Normal pulses.     Heart sounds: Normal heart sounds.  Pulmonary:     Effort: Pulmonary effort is normal. No respiratory distress.     Breath sounds: Normal breath sounds.  Genitourinary:      Comments: Patient has papular lesion present directly below right outer labia.  No purulent drainage noted.  It is pinpoint in size.  Self swab performed prior to examination.  No obvious discharge noted. Neurological:     General: No focal deficit present.     Mental Status: She is alert and oriented to person, place, and time. Mental status is at baseline.     Cranial Nerves: Cranial nerves 2-12 are intact.     Sensory: Sensation is intact.     Motor: Motor function is intact.     Coordination: Coordination is intact.     Gait: Gait is intact.  Psychiatric:        Mood and Affect: Mood normal.        Behavior: Behavior normal.        Thought Content: Thought content normal.        Judgment: Judgment normal.      UC Treatments / Results  Labs (all labs ordered are listed, but only abnormal results are displayed) Labs Reviewed  CERVICOVAGINAL ANCILLARY ONLY    EKG   Radiology No results found.  Procedures Procedures (including critical care time)  Medications Ordered in UC Medications - No data to display  Initial Impression / Assessment and Plan / UC Course  I have reviewed the triage vital signs and the nursing notes.  Pertinent labs & imaging results that were available during my care of the patient were reviewed by me and considered in my medical decision making (see chart for details).     Patient has folliculitis.  No concern for genital herpes.  Will treat with doxycycline antibiotic.  Advised warm compresses as well. I&D not indicated. Cervicovaginal swab pending per patient request.  Will await results for any further treatment.  Patient to refrain from sexual activity until test results and treatment are complete.  Patient declined HIV and RPR  testing.  Patient has elevated blood pressure reading but reports that she has not taken her blood pressure medication.  Patient advised to restart blood pressure medication at previously prescribed dose.  No signs of hypertensive urgency and neuro exam is normal.  Discussed return precautions.  Patient verbalized understanding and was agreeable with plan. Final Clinical Impressions(s) / UC Diagnoses   Final diagnoses:  Screening examination for venereal disease  Vaginal itching  Folliculitis     Discharge Instructions      STD vaginal swab is pending.  We will call if anything is positive.  You are being treated with antibiotic for bump to your vaginal area.  Please follow-up if symptoms persist or worsen.    ED Prescriptions     Medication Sig Dispense Auth. Provider   doxycycline (VIBRAMYCIN) 100 MG capsule Take 1 capsule (100 mg total) by mouth 2 (two) times daily. 20 capsule Teodora Medici, [place]      PDMP not reviewed this encounter.   Teodora Medici, [place] 04/20/22 [place], [place], [place] 04/21/22 [phone]

## 2022-04-20 NOTE — Discharge Instructions (Signed)
STD vaginal swab is pending.  We will call if anything is positive.  You are being treated with antibiotic for bump to your vaginal area.  Please follow-up if symptoms persist or worsen.

## 2022-04-20 NOTE — ED Triage Notes (Signed)
Patient presents to Urgent Care with complaints of  since potential sti she st she thinks her husband is cheating on her. Patient reports she as itching as a symptom.

## 2022-04-22 ENCOUNTER — Telehealth (HOSPITAL_COMMUNITY): Payer: Self-pay | Admitting: Emergency Medicine

## 2022-04-22 LAB — CERVICOVAGINAL ANCILLARY ONLY
Bacterial Vaginitis (gardnerella): POSITIVE — AB
Candida Glabrata: NEGATIVE
Candida Vaginitis: NEGATIVE
Chlamydia: NEGATIVE
Comment: NEGATIVE
Comment: NEGATIVE
Comment: NEGATIVE
Comment: NEGATIVE
Comment: NEGATIVE
Comment: NORMAL
Neisseria Gonorrhea: NEGATIVE
Trichomonas: NEGATIVE

## 2022-04-22 MED ORDER — FLUCONAZOLE 150 MG PO TABS: 150.0000 mg | ORAL_TABLET | Freq: Once | ORAL | 0 refills | Status: AC

## 2022-04-22 MED ORDER — METRONIDAZOLE 500 MG PO TABS: 500.0000 mg | ORAL_TABLET | Freq: Two times a day (BID) | ORAL | 0 refills | Status: DC

## 2022-07-20 ENCOUNTER — Ambulatory Visit: Admit: 2022-07-20 | Discharge status: Absent without leave | Payer: PRIVATE HEALTH INSURANCE

## 2022-07-20 ENCOUNTER — Encounter: Payer: Self-pay | Admitting: Emergency Medicine

## 2022-07-20 ENCOUNTER — Ambulatory Visit
Admission: EM | Admit: 2022-07-20 | Discharge: 2022-07-20 | Disposition: A | Discharge status: Home / Self Care | Payer: PRIVATE HEALTH INSURANCE

## 2022-07-20 DIAGNOSIS — J069 Acute upper respiratory infection, unspecified: Secondary | ICD-10-CM

## 2022-07-20 MED ORDER — METHYLPREDNISOLONE 4 MG PO TBPK: ORAL_TABLET | ORAL | 0 refills | Status: DC

## 2022-07-20 MED ORDER — BENZONATATE 100 MG PO CAPS: 100.0000 mg | ORAL_CAPSULE | Freq: Three times a day (TID) | ORAL | 0 refills | Status: DC

## 2022-07-20 NOTE — Discharge Instructions (Signed)
You are being treated for a viral upper respiratory infection.  Your symptoms are most likely viral given their relatively short duration.  It is possible that you will need an antibiotic in the future however if your symptoms do not resolve within another week.  Use benzonatate to calm cough day or night.  Continue to use DayQuil or NyQuil for your other URI symptoms.  A steroid is being prescribed to help resolve your sinus inflammation and cough without antibiotic.  As discussed, take the oral steroid first thing in the morning to avoid nighttime sleep disturbance.   Follow-up with your primary care provider or here if your symptoms do not resolve.

## 2022-07-20 NOTE — ED Triage Notes (Signed)
Patient c/o nasal congestion and productive cough x 5 days.   Patient denies fever at home.   Patient endorses fatigue.   Patient has taken Dayquil with no relief of symptoms.

## 2022-07-20 NOTE — ED Provider Notes (Signed)
[facility]    CSN: [id number] Arrival date & time: 07/20/22  1020      History   Chief Complaint Chief Complaint  Patient presents with   Nasal Congestion   Cough    HPI Michelle Duarte is a 41 y.o. female.    Cough  Presents with complaint of symptoms x5 to 6 days.  Reports nasal congestion with yellow drainage, cough productive of yellow sputum, sinus pressure bilateral with pressure in her ears, fatigue.  Using DayQuil/NyQuil without relief of symptoms.  Wakes around 3 AM with cough.  Past Medical History:  Diagnosis Date   Abnormal Pap smear 9678   ASCUS   Complication of anesthesia    Depression    Post partum   Hx of bilateral breast reduction surgery 2007   Hypertension    Hypokalemia    Insomnia    PONV (postoperative nausea and vomiting)    nausea   Vaginal Pap smear, abnormal     Patient Active Problem List   Diagnosis Date Noted   Nonspecific abnormal electrocardiogram (ECG) (EKG) 10/04/2019   Preop cardiovascular exam 10/04/2019   Chronic hypertension in pregnancy 12/09/2015   Left hip pain 02/21/2015   BP (high blood pressure) 01/17/2015   Vaginal delivery 08/06/2011   CONSTIPATION 01/19/2008    Past Surgical History:  Procedure Laterality Date   BREAST SURGERY     reduction   DILATION AND CURETTAGE OF UTERUS     with polypectomy   LAPAROSCOPIC GASTRIC SLEEVE RESECTION     MASS EXCISION Right 05/15/2016   Procedure: REMOVAL OF RIGHT ARM MASS;  Surgeon: Jackolyn Confer, MD;  Location: [facility];  Service: General;  Laterality: Right;   THERAPEUTIC ABORTION  2000   TUBAL LIGATION  02/01/2016   WISDOM TOOTH EXTRACTION      OB History     Gravida  4   Para  2   Term  2   Preterm      AB  2   Living  2      SAB  1   IAB  1   Ectopic      Multiple  0   Live Births  2            Home Medications    Prior to Admission medications   Medication Sig Start Date End Date Taking? Authorizing Provider  benzonatate  (TESSALON) 100 MG capsule Take 1 capsule (100 mg total) by mouth every 8 (eight) hours. 07/20/22  Yes Suhas Estis, Annie Main, FNP  methylPREDNISolone (MEDROL DOSEPAK) 4 MG TBPK tablet Steroid taper. Take as directed by packaging. 07/20/22  Yes Uel Davidow, Annie Main, FNP  Telmisartan-amLODIPine 40-10 MG TABS Take 1 tablet by mouth daily. 07/09/22  Yes [provider]  amLODipine (NORVASC) 5 MG tablet Take 1 tablet by mouth daily. 03/02/16   [provider]  BIOTIN PO Take 2 tablets by mouth daily.     [provider]  doxycycline (VIBRAMYCIN) 100 MG capsule Take 1 capsule (100 mg total) by mouth 2 (two) times daily. 04/20/22   Teodora Medici, FNP  famotidine (PEPCID) 20 MG tablet Take 1 tablet (20 mg total) by mouth 2 (two) times daily. 05/08/20   Milton Ferguson, MD  furosemide (LASIX) 40 MG tablet Take 1 tablet (40 mg total) by mouth daily. 08/28/21   Roemhildt, Lorin T, PA-C  irbesartan (AVAPRO) 300 MG tablet Take 300 mg by mouth daily. Patient not taking: Reported on 05/08/2020    [provider]  metroNIDAZOLE (FLAGYL) 500 MG tablet Take 1 tablet (500 mg total) by mouth 2 (two) times daily. 04/22/22   Chase Picket, MD  Multiple Vitamin (MULTIVITAMIN) tablet Take 1 tablet by mouth daily.     [provider]  omeprazole (PRILOSEC) 40 MG capsule Take by mouth. 01/16/20   [provider]  potassium chloride (K-DUR) 10 MEQ tablet Take 4 tablets (40 mEq total) by mouth daily. Patient not taking: Reported on 05/08/2020 05/15/16   Jackolyn Confer, MD  potassium chloride SA (KLOR-CON) 20 MEQ tablet Take 1 tablet (20 mEq total) by mouth 2 (two) times daily for 3 days. 08/28/21 08/31/21  Roemhildt, Lorin T, PA-C  ursodiol (ACTIGALL) 300 MG capsule Take 300 mg by mouth 2 (two) times daily. 04/30/20   [provider]    Family History Family History  Problem Relation Age of Onset   Hypertension Mother    Drug abuse Mother        recovering addict    Stroke Mother    Heart disease Mother    Hypertension Maternal Aunt    Diabetes Paternal Aunt    Hypertension Maternal Grandmother    Diabetes Paternal Grandmother    Hypertension Brother     Social History Social History   Tobacco Use   Smoking status: Former    Types: Cigars    Quit date: 08/2019    Years since quitting: 2.9   Smokeless tobacco: Never   Tobacco comments:    1 black and MIld a day  Vaping Use   Vaping Use: Never used  Substance Use Topics   Alcohol use: No    Alcohol/week: 0.0 standard drinks of alcohol   Drug use: No     Allergies   Benadryl [diphenhydramine hcl]   Review of Systems Review of Systems  Respiratory:  Positive for cough.      Physical Exam Triage Vital Signs ED Triage Vitals  Enc Vitals Group     BP 07/20/22 1035 135/86     Pulse Rate 07/20/22 1035 73     Resp 07/20/22 1035 14     Temp 07/20/22 1035 98.7 F (37.1 C)     Temp Source 07/20/22 1035 Oral     SpO2 07/20/22 1035 97 %     Weight --      Height --      Head Circumference --      Peak Flow --      Pain Score 07/20/22 1032 8     Pain Loc --      Pain Edu? --      Excl. in [place]? --    No data found.  Updated Vital Signs BP 135/86 (BP Location: Left Arm)   Pulse 73   Temp 98.7 F (37.1 C) (Oral)   Resp 14   LMP 07/07/2022 (Approximate)   SpO2 97%   Visual Acuity Right Eye Distance:   Left Eye Distance:   Bilateral Distance:    Right Eye Near:   Left Eye Near:    Bilateral Near:     Physical Exam Vitals reviewed.  Constitutional:      Appearance: Normal appearance.  HENT:     Right Ear: Tympanic membrane normal.     Left Ear: Tympanic membrane normal.     Nose:     Right Sinus: Maxillary sinus tenderness and frontal sinus tenderness present.     Left Sinus: Maxillary sinus tenderness and frontal sinus tenderness present.  Mouth/Throat:     Pharynx: Posterior oropharyngeal erythema present. No oropharyngeal exudate.  Eyes:      Conjunctiva/sclera: Conjunctivae normal.     Pupils: Pupils are equal, round, and reactive to light.  Cardiovascular:     Rate and Rhythm: Normal rate and regular rhythm.     Pulses: Normal pulses.     Heart sounds: Normal heart sounds.  Pulmonary:     Effort: Pulmonary effort is normal.     Breath sounds: Wheezing present.     Comments: Expiratory wheezes are present in the left upper lobe. Skin:    General: Skin is warm and dry.  Neurological:     General: No focal deficit present.     Mental Status: She is alert.  Psychiatric:        Mood and Affect: Mood normal.        Behavior: Behavior normal.      UC Treatments / Results  Labs (all labs ordered are listed, but only abnormal results are displayed) Labs Reviewed - No data to display  EKG   Radiology No results found.  Procedures Procedures (including critical care time)  Medications Ordered in UC Medications - No data to display  Initial Impression / Assessment and Plan / UC Course  I have reviewed the triage vital signs and the nursing notes.  Pertinent labs & imaging results that were available during my care of the patient were reviewed by me and considered in my medical decision making (see chart for details).    Wheezes heard in left upper lobe, expiratory.  Patient denies history of asthma.  Frontal and maxillary tenderness to palpation.  Pharynx erythematous.  Exam is otherwise unremarkable.  Likely viral etiology given duration of symptoms.  Will prescribe benzonatate for cough as well as Medrol dose pack for relief of acute sinus inflammation causing her discomfort.  Follow-up with your primary care provider or here if symptoms do not resolve  Final Clinical Impressions(s) / UC Diagnoses   Final diagnoses:  Viral upper respiratory tract infection with cough     Discharge Instructions      You are being treated for a viral upper respiratory infection.  Your symptoms are most likely viral given  their relatively short duration.  It is possible that you will need an antibiotic in the future however if your symptoms do not resolve within another week.  Use benzonatate to calm cough day or night.  Continue to use DayQuil or NyQuil for your other URI symptoms.  A steroid is being prescribed to help resolve your sinus inflammation and cough without antibiotic.  As discussed, take the oral steroid first thing in the morning to avoid nighttime sleep disturbance.   Follow-up with your primary care provider or here if your symptoms do not resolve.   ED Prescriptions     Medication Sig Dispense Auth. Provider   methylPREDNISolone (MEDROL DOSEPAK) 4 MG TBPK tablet Steroid taper. Take as directed by packaging. 21 tablet Mikiala Fugett, FNP   benzonatate (TESSALON) 100 MG capsule Take 1 capsule (100 mg total) by mouth every 8 (eight) hours. 21 capsule Luceil Herrin, FNP      PDMP not reviewed this encounter.   Rose Phi, [place] 07/20/22 1134

## 2023-04-06 ENCOUNTER — Ambulatory Visit (INDEPENDENT_AMBULATORY_CARE_PROVIDER_SITE_OTHER)
Admission: EM | Admit: 2023-04-06 | Discharge: 2023-04-06 | Disposition: A | Discharge status: Home / Self Care | Payer: PRIVATE HEALTH INSURANCE | Source: Home / Self Care

## 2023-04-06 DIAGNOSIS — N3001 Acute cystitis with hematuria: Secondary | ICD-10-CM | POA: Insufficient documentation

## 2023-04-06 DIAGNOSIS — R109 Unspecified abdominal pain: Secondary | ICD-10-CM | POA: Insufficient documentation

## 2023-04-06 DIAGNOSIS — A4151 Sepsis due to Escherichia coli [E. coli]: Secondary | ICD-10-CM | POA: Diagnosis not present

## 2023-04-06 DIAGNOSIS — N12 Tubulo-interstitial nephritis, not specified as acute or chronic: Secondary | ICD-10-CM | POA: Diagnosis not present

## 2023-04-06 DIAGNOSIS — R509 Fever, unspecified: Secondary | ICD-10-CM | POA: Insufficient documentation

## 2023-04-06 LAB — POCT URINALYSIS DIP (MANUAL ENTRY)
Bilirubin, UA: NEGATIVE
Glucose, UA: NEGATIVE mg/dL
Nitrite, UA: NEGATIVE
Protein Ur, POC: 100 mg/dL — AB
Spec Grav, UA: 1.015 (ref 1.010–1.025)
Urobilinogen, UA: 0.2 E.U./dL
pH, UA: 6.5 (ref 5.0–8.0)

## 2023-04-06 MED ORDER — FLUCONAZOLE 150 MG PO TABS: 150.0000 mg | ORAL_TABLET | Freq: Every day | ORAL | 0 refills | Status: DC

## 2023-04-06 MED ORDER — CEFTRIAXONE SODIUM 1 G IJ SOLR
1.0000 g | Freq: Once | INTRAMUSCULAR | Status: AC
  Administered 2023-04-06: 1 g via INTRAMUSCULAR

## 2023-04-06 MED ORDER — SULFAMETHOXAZOLE-TRIMETHOPRIM 800-160 MG PO TABS: 1.0000 | ORAL_TABLET | Freq: Two times a day (BID) | ORAL | 0 refills | Status: DC

## 2023-04-06 NOTE — ED Provider Notes (Addendum)
[facility]    CSN: [id number] Arrival date & time: 04/06/23  1653      History   Chief Complaint No chief complaint on file.   HPI Michelle Duarte is a 42 y.o. female.   Patient presents with right flank pain, body aches, chills, fever that started yesterday.  Patient is not sure of Tmax at home but states that she felt feverish.  Denies dysuria, urinary frequency, hematuria, vaginal discharge, nausea, vomiting.  Denies any associated upper respiratory symptoms, cough, sore throat.  Denies history of kidney stones or frequent urinary tract infections. Patient took theraflu for symptoms prior to arrival.      Past Medical History:  Diagnosis Date   Abnormal Pap smear 2011   ASCUS   Complication of anesthesia    Depression    Post partum   Hx of bilateral breast reduction surgery 2007   Hypertension    Hypokalemia    Insomnia    PONV (postoperative nausea and vomiting)    nausea   Vaginal Pap smear, abnormal     Patient Active Problem List   Diagnosis Date Noted   Nonspecific abnormal electrocardiogram (ECG) (EKG) 10/04/2019   Preop cardiovascular exam 10/04/2019   Chronic hypertension in pregnancy 12/09/2015   Left hip pain 02/21/2015   BP (high blood pressure) 01/17/2015   Vaginal delivery 08/06/2011   CONSTIPATION 01/19/2008    Past Surgical History:  Procedure Laterality Date   BREAST SURGERY     reduction   DILATION AND CURETTAGE OF UTERUS     with polypectomy   LAPAROSCOPIC GASTRIC SLEEVE RESECTION     MASS EXCISION Right 05/15/2016   Procedure: REMOVAL OF RIGHT ARM MASS;  Surgeon: Avel Peace, MD;  Location: [facility] OR;  Service: General;  Laterality: Right;   THERAPEUTIC ABORTION  2000   TUBAL LIGATION  02/01/2016   WISDOM TOOTH EXTRACTION      OB History     Gravida  4   Para  2   Term  2   Preterm      AB  2   Living  2      SAB  1   IAB  1   Ectopic      Multiple  0   Live Births  2            Home  Medications    Prior to Admission medications   Medication Sig Start Date End Date Taking? Authorizing Provider  fluconazole (DIFLUCAN) 150 MG tablet Take 1 tablet (150 mg total) by mouth daily. Take at first sign of vaginal yeast. 04/06/23  Yes Wasyl Dornfeld, Acie Fredrickson, FNP  sulfamethoxazole-trimethoprim (BACTRIM DS) 800-160 MG tablet Take 1 tablet by mouth 2 (two) times daily for 7 days. 04/06/23 04/13/23 Yes Jandel Patriarca, Acie Fredrickson, FNP  amLODipine (NORVASC) 5 MG tablet Take 1 tablet by mouth daily. 03/02/16   [provider]  benzonatate (TESSALON) 100 MG capsule Take 1 capsule (100 mg total) by mouth every 8 (eight) hours. 07/20/22   Immordino, Jeannett Senior, FNP  BIOTIN PO Take 2 tablets by mouth daily.     [provider]  doxycycline (VIBRAMYCIN) 100 MG capsule Take 1 capsule (100 mg total) by mouth 2 (two) times daily. 04/20/22   Gustavus Bryant, FNP  famotidine (PEPCID) 20 MG tablet Take 1 tablet (20 mg total) by mouth 2 (two) times daily. 05/08/20   Bethann Berkshire, MD  furosemide (LASIX) 40 MG tablet Take 1 tablet (40 mg total)  by mouth daily. 08/28/21   Roemhildt, Lorin T, PA-C  irbesartan (AVAPRO) 300 MG tablet Take 300 mg by mouth daily. Patient not taking: Reported on 05/08/2020    [provider]  methylPREDNISolone (MEDROL DOSEPAK) 4 MG TBPK tablet Steroid taper. Take as directed by packaging. 07/20/22   Immordino, Jeannett Senior, FNP  metroNIDAZOLE (FLAGYL) 500 MG tablet Take 1 tablet (500 mg total) by mouth 2 (two) times daily. 04/22/22   Merrilee Jansky, MD  Multiple Vitamin (MULTIVITAMIN) tablet Take 1 tablet by mouth daily.     [provider]  omeprazole (PRILOSEC) 40 MG capsule Take by mouth. 01/16/20   [provider]  potassium chloride (K-DUR) 10 MEQ tablet Take 4 tablets (40 mEq total) by mouth daily. Patient not taking: Reported on 05/08/2020 05/15/16   Avel Peace, MD  potassium chloride SA (KLOR-CON) 20 MEQ tablet Take 1 tablet (20 mEq total) by mouth 2 (two)  times daily for 3 days. 08/28/21 08/31/21  Roemhildt, Lorin T, PA-C  Telmisartan-amLODIPine 40-10 MG TABS Take 1 tablet by mouth daily. 07/09/22   [provider]  ursodiol (ACTIGALL) 300 MG capsule Take 300 mg by mouth 2 (two) times daily. 04/30/20   [provider]    Family History Family History  Problem Relation Age of Onset   Hypertension Mother    Drug abuse Mother        recovering addict   Stroke Mother    Heart disease Mother    Hypertension Maternal Aunt    Diabetes Paternal Aunt    Hypertension Maternal Grandmother    Diabetes Paternal Grandmother    Hypertension Brother     Social History Social History   Tobacco Use   Smoking status: Former    Types: Cigars    Quit date: 08/2019    Years since quitting: 3.6   Smokeless tobacco: Never   Tobacco comments:    1 black and MIld a day  Vaping Use   Vaping Use: Never used  Substance Use Topics   Alcohol use: No    Alcohol/week: 0.0 standard drinks of alcohol   Drug use: No     Allergies   Benadryl [diphenhydramine hcl]   Review of Systems Review of Systems Per HPI  Physical Exam Triage Vital Signs ED Triage Vitals  Enc Vitals Group     BP 04/06/23 1742 106/61     Pulse Rate 04/06/23 1742 (!) 104     Resp 04/06/23 1742 16     Temp 04/06/23 1742 (!) 101 F (38.3 C)     Temp Source 04/06/23 1742 Oral     SpO2 04/06/23 1742 94 %     Weight --      Height --      Head Circumference --      Peak Flow --      Pain Score 04/06/23 1746 6     Pain Loc --      Pain Edu? --      Excl. in GC? --    No data found.  Updated Vital Signs BP 106/61 (BP Location: Right Arm)   Pulse (!) 104   Temp (!) 101 F (38.3 C) (Oral)   Resp 16   LMP 03/16/2023 (Within Days)   SpO2 94%   Visual Acuity Right Eye Distance:   Left Eye Distance:   Bilateral Distance:    Right Eye Near:   Left Eye Near:    Bilateral Near:     Physical Exam  Constitutional:      General: She is not in acute  distress.    Appearance: Normal appearance. She is not toxic-appearing.  HENT:     Head: Normocephalic and atraumatic.  Eyes:     Extraocular Movements: Extraocular movements intact.     Conjunctiva/sclera: Conjunctivae normal.  Cardiovascular:     Rate and Rhythm: Regular rhythm. Tachycardia present.     Pulses: Normal pulses.     Heart sounds: Normal heart sounds.  Pulmonary:     Effort: Pulmonary effort is normal.     Breath sounds: Normal breath sounds.  Musculoskeletal:     Comments: Patient has tenderness to palpation overlying right lower back/flank area.  Neurological:     General: No focal deficit present.     Mental Status: She is alert and oriented to person, place, and time. Mental status is at baseline.  Psychiatric:        Mood and Affect: Mood normal.        Behavior: Behavior normal.        Thought Content: Thought content normal.        Judgment: Judgment normal.      UC Treatments / Results  Labs (all labs ordered are listed, but only abnormal results are displayed) Labs Reviewed  POCT URINALYSIS DIP (MANUAL ENTRY) - Abnormal; Notable for the following components:      Result Value   Clarity, UA cloudy (*)    Ketones, POC UA trace (5) (*)    Blood, UA small (*)    Protein Ur, POC =100 (*)    Leukocytes, UA Small (1+) (*)    All other components within normal limits  URINE CULTURE  CBC  BASIC METABOLIC PANEL    EKG   Radiology No results found.  Procedures Procedures (including critical care time)  Medications Ordered in UC Medications  cefTRIAXone (ROCEPHIN) injection 1 g (1 g Intramuscular Given 04/06/23 1834)    Initial Impression / Assessment and Plan / UC Course  I have reviewed the triage vital signs and the nursing notes.  Pertinent labs & imaging results that were available during my care of the patient were reviewed by me and considered in my medical decision making (see chart for details).     I am concerned for acute  pyelonephritis given white blood cells on UA and patient's associated symptoms, fever, tachycardia.  Therefore, patient was advised to go to the emergency department for further evaluation and management but declined ER evaluation at this time.  Therefore, will do limited evaluation and treatment here in urgent care.  IM Rocephin administered today and patient was sent Bactrim to the pharmacy.  Will obtain urine culture, BMP, CBC.  Patient encouraged to increase adequate fluid hydration and fever monitoring and management.  Patient was advised of strict ER precautions if symptoms persist or worsen over the next 24 hours.  Patient requested Diflucan as antibiotics typically give her a yeast infection so this was prescribed to take at first sign of vaginal yeast. patient verbalized understanding and was agreeable with plan. Final Clinical Impressions(s) / UC Diagnoses   Final diagnoses:  Acute cystitis with hematuria  Flank pain  Fever, unspecified     Discharge Instructions      I have prescribed you an antibiotic for concern for kidney infection and you were given an antibiotic injection today.  Blood work is pending as well as urine culture.  We will call if it is abnormal.  Recommend that you go  to the emergency department if symptoms persist or worsen in the next 24 hours.    ED Prescriptions     Medication Sig Dispense Auth. Provider   sulfamethoxazole-trimethoprim (BACTRIM DS) 800-160 MG tablet Take 1 tablet by mouth 2 (two) times daily for 7 days. 14 tablet [place], [place], [place]   fluconazole (DIFLUCAN) 150 MG tablet Take 1 tablet (150 mg total) by mouth daily. Take at first sign of vaginal yeast. 1 tablet [place], Acie Fredrickson, [place]      PDMP not reviewed this encounter.   Gustavus Bryant, [place] 04/06/23 1848    Gustavus Bryant, [place] 04/06/23 [id number]

## 2023-04-06 NOTE — Discharge Instructions (Signed)
I have prescribed you an antibiotic for concern for kidney infection and you were given an antibiotic injection today.  Blood work is pending as well as urine culture.  We will call if it is abnormal.  Recommend that you go to the emergency department if symptoms persist or worsen in the next 24 hours.

## 2023-04-06 NOTE — ED Triage Notes (Signed)
Pt c/o right side pain body aches chills, fever right sided back painx 1 day

## 2023-04-07 ENCOUNTER — Emergency Department (HOSPITAL_BASED_OUTPATIENT_CLINIC_OR_DEPARTMENT_OTHER): Payer: PRIVATE HEALTH INSURANCE

## 2023-04-07 ENCOUNTER — Encounter (HOSPITAL_BASED_OUTPATIENT_CLINIC_OR_DEPARTMENT_OTHER): Payer: Self-pay | Admitting: Emergency Medicine

## 2023-04-07 ENCOUNTER — Inpatient Hospital Stay (HOSPITAL_BASED_OUTPATIENT_CLINIC_OR_DEPARTMENT_OTHER)
Admission: EM | Admit: 2023-04-07 | Discharge: 2023-04-09 | DRG: 872 | Disposition: A | Discharge status: Home / Self Care | Payer: PRIVATE HEALTH INSURANCE | Attending: Internal Medicine | Admitting: Internal Medicine

## 2023-04-07 DIAGNOSIS — N12 Tubulo-interstitial nephritis, not specified as acute or chronic: Secondary | ICD-10-CM | POA: Diagnosis not present

## 2023-04-07 DIAGNOSIS — Z813 Family history of other psychoactive substance abuse and dependence: Secondary | ICD-10-CM | POA: Diagnosis not present

## 2023-04-07 DIAGNOSIS — Z8249 Family history of ischemic heart disease and other diseases of the circulatory system: Secondary | ICD-10-CM

## 2023-04-07 DIAGNOSIS — Z1611 Resistance to penicillins: Secondary | ICD-10-CM | POA: Diagnosis present

## 2023-04-07 DIAGNOSIS — Z87891 Personal history of nicotine dependence: Secondary | ICD-10-CM

## 2023-04-07 DIAGNOSIS — Z79899 Other long term (current) drug therapy: Secondary | ICD-10-CM | POA: Diagnosis not present

## 2023-04-07 DIAGNOSIS — I10 Essential (primary) hypertension: Secondary | ICD-10-CM | POA: Diagnosis present

## 2023-04-07 DIAGNOSIS — A4151 Sepsis due to Escherichia coli [E. coli]: Secondary | ICD-10-CM | POA: Diagnosis present

## 2023-04-07 DIAGNOSIS — Z9884 Bariatric surgery status: Secondary | ICD-10-CM | POA: Diagnosis not present

## 2023-04-07 DIAGNOSIS — E876 Hypokalemia: Secondary | ICD-10-CM | POA: Diagnosis present

## 2023-04-07 DIAGNOSIS — Z1623 Resistance to quinolones and fluoroquinolones: Secondary | ICD-10-CM | POA: Diagnosis present

## 2023-04-07 DIAGNOSIS — R739 Hyperglycemia, unspecified: Secondary | ICD-10-CM | POA: Diagnosis present

## 2023-04-07 DIAGNOSIS — Z833 Family history of diabetes mellitus: Secondary | ICD-10-CM | POA: Diagnosis not present

## 2023-04-07 DIAGNOSIS — N10 Acute tubulo-interstitial nephritis: Secondary | ICD-10-CM | POA: Diagnosis present

## 2023-04-07 DIAGNOSIS — D3502 Benign neoplasm of left adrenal gland: Secondary | ICD-10-CM | POA: Diagnosis present

## 2023-04-07 DIAGNOSIS — Z823 Family history of stroke: Secondary | ICD-10-CM

## 2023-04-07 DIAGNOSIS — Z888 Allergy status to other drugs, medicaments and biological substances status: Secondary | ICD-10-CM

## 2023-04-07 LAB — COMPREHENSIVE METABOLIC PANEL
ALT: 16 U/L (ref 0–44)
AST: 19 U/L (ref 15–41)
Albumin: 3.6 g/dL (ref 3.5–5.0)
Alkaline Phosphatase: 64 U/L (ref 38–126)
Anion gap: 8 (ref 5–15)
BUN: 19 mg/dL (ref 6–20)
CO2: 26 mmol/L (ref 22–32)
Calcium: 9.1 mg/dL (ref 8.9–10.3)
Chloride: 102 mmol/L (ref 98–111)
Creatinine, Ser: 0.99 mg/dL (ref 0.44–1.00)
GFR, Estimated: >60 mL/min (ref >60)
Glucose, Bld: 118 mg/dL — ABNORMAL HIGH (ref 70–99)
Potassium: 2.9 mmol/L — ABNORMAL LOW (ref 3.5–5.1)
Sodium: 136 mmol/L (ref 135–145)
Total Bilirubin: 0.4 mg/dL (ref 0.3–1.2)
Total Protein: 6.9 g/dL (ref 6.5–8.1)

## 2023-04-07 LAB — CBC
Hematocrit: 38.5 % (ref 34.0–46.6)
Hemoglobin: 12.6 g/dL (ref 11.1–15.9)
MCH: 27.2 pg (ref 26.6–33.0)
MCHC: 32.7 g/dL (ref 31.5–35.7)
MCV: 83 fL (ref 79–97)
Platelets: 218 10*3/uL (ref 150–450)
RBC: 4.63 x10E6/uL (ref 3.77–5.28)
RDW: 15.3 % (ref 11.7–15.4)
WBC: 17.9 10*3/uL — ABNORMAL HIGH (ref 3.4–10.8)

## 2023-04-07 LAB — BASIC METABOLIC PANEL
BUN/Creatinine Ratio: 13 (ref 9–23)
BUN: 12 mg/dL (ref 6–24)
CO2: 21 mmol/L (ref 20–29)
Calcium: 8.9 mg/dL (ref 8.7–10.2)
Chloride: 101 mmol/L (ref 96–106)
Creatinine, Ser: 0.93 mg/dL (ref 0.57–1.00)
Glucose: 92 mg/dL (ref 70–99)
Potassium: 3.1 mmol/L — ABNORMAL LOW (ref 3.5–5.2)
Sodium: 137 mmol/L (ref 134–144)
eGFR: 79 mL/min/{1.73_m2} (ref >59)

## 2023-04-07 LAB — CBC WITH DIFFERENTIAL/PLATELET
Abs Immature Granulocytes: 0.08 10*3/uL — ABNORMAL HIGH (ref 0.00–0.07)
Basophils Absolute: 0 10*3/uL (ref 0.0–0.1)
Basophils Relative: 0 %
Eosinophils Absolute: 0 10*3/uL (ref 0.0–0.5)
Eosinophils Relative: 0 %
HCT: 38.1 % (ref 36.0–46.0)
Hemoglobin: 12.6 g/dL (ref 12.0–15.0)
Immature Granulocytes: 1 %
Lymphocytes Relative: 6 %
Lymphs Abs: 1.1 10*3/uL (ref 0.7–4.0)
MCH: 26.9 pg (ref 26.0–34.0)
MCHC: 33.1 g/dL (ref 30.0–36.0)
MCV: 81.2 fL (ref 80.0–100.0)
Monocytes Absolute: 0.1 10*3/uL (ref 0.1–1.0)
Monocytes Relative: 1 %
Neutro Abs: 16.4 10*3/uL — ABNORMAL HIGH (ref 1.7–7.7)
Neutrophils Relative %: 92 %
Platelets: 205 10*3/uL (ref 150–400)
RBC: 4.69 MIL/uL (ref 3.87–5.11)
RDW: 16.1 % — ABNORMAL HIGH (ref 11.5–15.5)
WBC: 17.8 10*3/uL — ABNORMAL HIGH (ref 4.0–10.5)
nRBC: 0 % (ref 0.0–0.2)

## 2023-04-07 LAB — URINALYSIS, W/ REFLEX TO CULTURE (INFECTION SUSPECTED)
Bacteria, UA: NONE SEEN
Bilirubin Urine: NEGATIVE
Glucose, UA: NEGATIVE mg/dL
Ketones, ur: NEGATIVE mg/dL
Nitrite: NEGATIVE
Specific Gravity, Urine: 1.013 (ref 1.005–1.030)
pH: 7 (ref 5.0–8.0)

## 2023-04-07 LAB — LIPASE, BLOOD: Lipase: 18 U/L (ref 11–51)

## 2023-04-07 LAB — URINE CULTURE

## 2023-04-07 LAB — PREGNANCY, URINE: Preg Test, Ur: NEGATIVE

## 2023-04-07 MED ORDER — ONDANSETRON HCL 4 MG/2ML IJ SOLN
4.0000 mg | Freq: Once | INTRAMUSCULAR | Status: AC
  Administered 2023-04-07: 4 mg via INTRAVENOUS
  Filled 2023-04-07: qty 2

## 2023-04-07 MED ORDER — POTASSIUM CHLORIDE IN NACL 20-0.9 MEQ/L-% IV SOLN
INTRAVENOUS | Status: DC
  Filled 2023-04-07 (×2): qty 1000

## 2023-04-07 MED ORDER — OXYCODONE HCL 5 MG PO TABS
5.0000 mg | ORAL_TABLET | ORAL | Status: DC | PRN
  Administered 2023-04-07 – 2023-04-08 (×3): 5 mg via ORAL
  Filled 2023-04-07 (×3): qty 1

## 2023-04-07 MED ORDER — SODIUM CHLORIDE 0.9 % IV SOLN
1.0000 g | INTRAVENOUS | Status: DC
  Administered 2023-04-08: 1 g via INTRAVENOUS
  Filled 2023-04-07: qty 10

## 2023-04-07 MED ORDER — SODIUM CHLORIDE 0.9 % IV BOLUS
1000.0000 mL | Freq: Once | INTRAVENOUS | Status: AC
  Administered 2023-04-07: 1000 mL via INTRAVENOUS

## 2023-04-07 MED ORDER — POTASSIUM CHLORIDE CRYS ER 20 MEQ PO TBCR
40.0000 meq | EXTENDED_RELEASE_TABLET | Freq: Once | ORAL | Status: AC
  Administered 2023-04-07: 40 meq via ORAL
  Filled 2023-04-07: qty 2

## 2023-04-07 MED ORDER — FENTANYL CITRATE PF 50 MCG/ML IJ SOSY
50.0000 ug | PREFILLED_SYRINGE | Freq: Once | INTRAMUSCULAR | Status: AC
  Administered 2023-04-07: 50 ug via INTRAVENOUS
  Filled 2023-04-07: qty 1

## 2023-04-07 MED ORDER — HYDROMORPHONE HCL 1 MG/ML IJ SOLN
1.0000 mg | Freq: Once | INTRAMUSCULAR | Status: AC
  Administered 2023-04-07: 1 mg via INTRAVENOUS
  Filled 2023-04-07: qty 1

## 2023-04-07 MED ORDER — TELMISARTAN-AMLODIPINE 40-10 MG PO TABS: 1.0000 | ORAL_TABLET | Freq: Every day | ORAL | Status: DC

## 2023-04-07 MED ORDER — IOHEXOL 300 MG/ML  SOLN
100.0000 mL | Freq: Once | INTRAMUSCULAR | Status: AC | PRN
  Administered 2023-04-07: 85 mL via INTRATHECAL

## 2023-04-07 MED ORDER — ENOXAPARIN SODIUM 40 MG/0.4ML IJ SOSY
40.0000 mg | PREFILLED_SYRINGE | INTRAMUSCULAR | Status: DC
  Administered 2023-04-07 – 2023-04-08 (×2): 40 mg via SUBCUTANEOUS
  Filled 2023-04-07 (×2): qty 0.4

## 2023-04-07 MED ORDER — HYDROCODONE-ACETAMINOPHEN 5-325 MG PO TABS
2.0000 | ORAL_TABLET | Freq: Once | ORAL | Status: AC
  Administered 2023-04-07: 2 via ORAL
  Filled 2023-04-07: qty 2

## 2023-04-07 MED ORDER — ACETAMINOPHEN 650 MG RE SUPP: 650.0000 mg | Freq: Four times a day (QID) | RECTAL | Status: DC | PRN

## 2023-04-07 MED ORDER — SODIUM CHLORIDE 0.9 % IV SOLN
1.0000 g | Freq: Once | INTRAVENOUS | Status: AC
  Administered 2023-04-07: 1 g via INTRAVENOUS
  Filled 2023-04-07: qty 10

## 2023-04-07 MED ORDER — ACETAMINOPHEN 325 MG PO TABS
650.0000 mg | ORAL_TABLET | Freq: Four times a day (QID) | ORAL | Status: DC | PRN
  Administered 2023-04-08 (×2): 650 mg via ORAL
  Filled 2023-04-07 (×2): qty 2

## 2023-04-07 MED ORDER — IRBESARTAN 150 MG PO TABS
150.0000 mg | ORAL_TABLET | Freq: Every day | ORAL | Status: DC
  Administered 2023-04-07 – 2023-04-09 (×3): 150 mg via ORAL
  Filled 2023-04-07 (×3): qty 1

## 2023-04-07 MED ORDER — AMLODIPINE BESYLATE 10 MG PO TABS
10.0000 mg | ORAL_TABLET | Freq: Every day | ORAL | Status: DC
  Administered 2023-04-07 – 2023-04-09 (×3): 10 mg via ORAL
  Filled 2023-04-07 (×3): qty 1

## 2023-04-07 MED ORDER — ONDANSETRON HCL 4 MG/2ML IJ SOLN
4.0000 mg | Freq: Four times a day (QID) | INTRAMUSCULAR | Status: DC | PRN
  Administered 2023-04-07 – 2023-04-08 (×2): 4 mg via INTRAVENOUS
  Filled 2023-04-07 (×2): qty 2

## 2023-04-07 MED ORDER — ONDANSETRON HCL 4 MG PO TABS: 4.0000 mg | ORAL_TABLET | Freq: Four times a day (QID) | ORAL | Status: DC | PRN

## 2023-04-07 NOTE — H&P (Signed)
History and Physical    Patient: Michelle Duarte ZOX:[id number] DOB: 1981/11/05 DOA: 04/07/2023 DOS: the patient was seen and examined on 04/07/2023 PCP: [facility]  Patient coming from: Home  Chief Complaint:  Chief Complaint  Patient presents with   Flank Pain   HPI: Michelle Duarte is a 42 y.o. female with medical history significant of abnormal Pap smear, postpartum depression, bilateral breast reduction surgery, hypertension, hypokalemia, history of iron deficiency anemia, history of sleeve gastrectomy, history of elevated BP without diagnosis of hypertension who presented to the emergency department with right-sided flank pain associated with fever and dysuria since Monday.  She was seen in the urgent care center, given antibiotics and told to go to the emergency department if symptoms got worse.  She has been having fever, chills, night sweats, nausea, fatigue and malaise.  No No sore throat, rhinorrhea, dyspnea, wheezing or hemoptysis.  No chest pain, palpitations, diaphoresis, PND, orthopnea or pitting edema of the lower extremities. No diarrhea, constipation, melena or hematochezia.  No flank pain, dysuria, frequency or hematuria.  No polyuria, polydipsia, polyphagia or blurred vision.  ED course: Initial vital signs were temperature 101.3 F, pulse 101, respiration 20, BP 135/81 mmHg O2 sat 99% on room air.  The patient received ceftriaxone 1 g IVPB, fentanyl 50 mcg IVP, Norco 5/325 mg p.o. x 1 ondansetron 4 mg IVP x 2, KCl 40 mEq p.o. x 1 and 1000 mL normal saline bolus.  Lab work: Her urine analysis showed moderate hemoglobinuria and trace protein and small leukocyte esterase.  Microscopic examination 21-50 RBC, 21-50 WBC and no bacteria.  There were positive crystals.  Urine pregnancy test was negative.  CBC showed a white count of 17.8 with 92% neutrophils, hemoglobin 12.6 g/dL platelets 811.  Lipase was normal.  CMP with a potassium of 2.9 mmol/L and glucose 118 mg/dL, the  rest of the CMP measurements were normal  Imaging: CT abdomen/pelvis with contrast showing abnormal right kidney with evidence of acute pyelonephritis, urothelial inflammation.  No urinary calculus or obstructive uropathy.  There is a left adrenal mass measuring 2.1 cm, probably benign adenoma.  Adrenal washout CT or chemical shift MRI recommended for further characterization.  No other acute or inflammatory process identified in the abdomen.  There is evidence of dislodged bilateral tubal ligation clips.   Review of Systems: As mentioned in the history of present illness. All other systems reviewed and are negative. Past Medical History:  Diagnosis Date   Abnormal Pap smear 2011   ASCUS   Complication of anesthesia    Depression    Post partum   Hx of bilateral breast reduction surgery 2007   Hypertension    Hypokalemia    Insomnia    PONV (postoperative nausea and vomiting)    nausea   Vaginal Pap smear, abnormal    Past Surgical History:  Procedure Laterality Date   BREAST SURGERY     reduction   DILATION AND CURETTAGE OF UTERUS     with polypectomy   LAPAROSCOPIC GASTRIC SLEEVE RESECTION     MASS EXCISION Right 05/15/2016   Procedure: REMOVAL OF RIGHT ARM MASS;  Surgeon: Avel Peace, MD;  Location: [facility] OR;  Service: General;  Laterality: Right;   THERAPEUTIC ABORTION  2000   TUBAL LIGATION  02/01/2016   WISDOM TOOTH EXTRACTION     Social History:  reports that she quit smoking about 3 years ago. Her smoking use included cigars. She has never used smokeless tobacco. She  reports that she does not drink alcohol and does not use drugs.  Allergies  Allergen Reactions   Benadryl [Diphenhydramine Hcl] Shortness Of Breath, Itching and Nausea And Vomiting    IV form    Family History  Problem Relation Age of Onset   Hypertension Mother    Drug abuse Mother        recovering addict   Stroke Mother    Heart disease Mother    Hypertension Maternal Aunt    Diabetes Paternal  Aunt    Hypertension Maternal Grandmother    Diabetes Paternal Grandmother    Hypertension Brother     Prior to Admission medications   Medication Sig Start Date End Date Taking? Authorizing Provider  HUMIRA, 2 PEN, 80 MG/0.8ML PNKT Inject 80 mg into the skin.   Yes [provider]  Multiple Vitamin (MULTIVITAMIN) tablet Take 1 tablet by mouth daily.    Yes [provider]  Telmisartan-amLODIPine 40-10 MG TABS Take 1 tablet by mouth daily. 07/09/22  Yes [provider]  benzonatate (TESSALON) 100 MG capsule Take 1 capsule (100 mg total) by mouth every 8 (eight) hours. Patient not taking: Reported on 04/07/2023 07/20/22   Immordino, Jeannett Senior, FNP  doxycycline (VIBRAMYCIN) 100 MG capsule Take 1 capsule (100 mg total) by mouth 2 (two) times daily. Patient not taking: Reported on 04/07/2023 04/20/22   Gustavus Bryant, FNP  famotidine (PEPCID) 20 MG tablet Take 1 tablet (20 mg total) by mouth 2 (two) times daily. Patient not taking: Reported on 04/07/2023 05/08/20   Bethann Berkshire, MD  fluconazole (DIFLUCAN) 150 MG tablet Take 1 tablet (150 mg total) by mouth daily. Take at first sign of vaginal yeast. Patient not taking: Reported on 04/07/2023 04/06/23   Gustavus Bryant, FNP  furosemide (LASIX) 40 MG tablet Take 1 tablet (40 mg total) by mouth daily. Patient not taking: Reported on 04/07/2023 08/28/21   Roemhildt, Lorin T, PA-C  methylPREDNISolone (MEDROL DOSEPAK) 4 MG TBPK tablet Steroid taper. Take as directed by packaging. Patient not taking: Reported on 04/07/2023 07/20/22   Immordino, Jeannett Senior, FNP  metroNIDAZOLE (FLAGYL) 500 MG tablet Take 1 tablet (500 mg total) by mouth 2 (two) times daily. Patient not taking: Reported on 04/07/2023 04/22/22   Merrilee Jansky, MD  potassium chloride (K-DUR) 10 MEQ tablet Take 4 tablets (40 mEq total) by mouth daily. Patient not taking: Reported on 05/08/2020 05/15/16   Avel Peace, MD  potassium chloride SA (KLOR-CON) 20 MEQ tablet Take 1  tablet (20 mEq total) by mouth 2 (two) times daily for 3 days. 08/28/21 08/31/21  Roemhildt, Lorin T, PA-C  sulfamethoxazole-trimethoprim (BACTRIM DS) 800-160 MG tablet Take 1 tablet by mouth 2 (two) times daily for 7 days. Patient not taking: Reported on 04/07/2023 04/06/23 04/13/23  Gustavus Bryant, FNP    Physical Exam: Vitals:   04/07/23 1006 04/07/23 1020 04/07/23 1030 04/07/23 1353  BP:  102/74 104/68 116/77  Pulse:   72 63  Resp: 17  18 16   Temp: 98.2 F (36.8 C)   98.2 F (36.8 C)  TempSrc: Oral   Oral  SpO2:   98% 99%   Physical Exam Vitals and nursing note reviewed.  Constitutional:      General: She is awake. She is not in acute distress.    Appearance: Normal appearance. She is obese. She is ill-appearing.  HENT:     Head: Normocephalic.     Nose: No rhinorrhea.     Mouth/Throat:  Mouth: Mucous membranes are dry.  Eyes:     General: No scleral icterus.    Pupils: Pupils are equal, round, and reactive to light.  Neck:     Vascular: No JVD.  Cardiovascular:     Rate and Rhythm: Normal rate and regular rhythm.     Heart sounds: S1 normal and S2 normal.  Pulmonary:     Effort: Pulmonary effort is normal.     Breath sounds: Normal breath sounds. No wheezing, rhonchi or rales.  Abdominal:     General: Bowel sounds are normal. There is no distension.     Palpations: Abdomen is soft.     Tenderness: There is abdominal tenderness in the suprapubic area. There is right CVA tenderness. There is no left CVA tenderness, guarding or rebound.  Musculoskeletal:     Cervical back: Neck supple.     Right lower leg: No edema.     Left lower leg: No edema.  Skin:    General: Skin is warm and dry.  Neurological:     General: No focal deficit present.     Mental Status: She is alert and oriented to person, place, and time.  Psychiatric:        Mood and Affect: Mood normal.        Behavior: Behavior normal. Behavior is cooperative.   Data Reviewed:  Results are pending,  will review when available.  Assessment and Plan: Principal Problem:   Pyelonephritis of right kidney Admit to MedSurg/inpatient. Continue ceftriaxone 1 g IVPB daily. Analgesics as needed. Antiemetics as needed. Follow-up urine culture and sensitivity. Follow-up CBC and chemistry in the morning.   Active Problems:   Hypokalemia Replacing. Follow potassium level in AM.    Hyperglycemia Check fasting glucose in AM.    Adrenal adenoma, left Discussed at length with the patient. Advised to follow-up with PCP within a few weeks.     Advance Care Planning:   Code Status: Full Code   Consults:   Family Communication:   Severity of Illness: The appropriate patient status for this patient is INPATIENT. Inpatient status is judged to be reasonable and necessary in order to provide the required intensity of service to ensure the patient's safety. The patient's presenting symptoms, physical exam findings, and initial radiographic and laboratory data in the context of their chronic comorbidities is felt to place them at high risk for further clinical deterioration. Furthermore, it is not anticipated that the patient will be medically stable for discharge from the hospital within 2 midnights of admission.   * I certify that at the point of admission it is my clinical judgment that the patient will require inpatient hospital care spanning beyond 2 midnights from the point of admission due to high intensity of service, high risk for further deterioration and high frequency of surveillance required.*  Author: Bobette Mo, MD 04/07/2023 2:13 PM  For on call review [email or web address].   This document was prepared using Dragon voice recognition software and may contain some unintended transcription errors.

## 2023-04-07 NOTE — ED Triage Notes (Signed)
Right sided flank pain started Monday. Seen at UC  treated for kidney infection told to go to ED if feeling worse Took tylenol at 1am

## 2023-04-07 NOTE — ED Notes (Signed)
George at [facility] was called around 8a confirming Bed Ready at [facility] 5W Room#1538. Transport delayed due to OR patient transports.-ABB(NS)

## 2023-04-07 NOTE — Progress Notes (Signed)
   Patient Name: otisha, davignon DOB: 03/15/1981 MRN: [id number] Transferring facility: [facility] Requesting provider: sheldon, md Reason for transfer: right pyelonephritis 42 yo AAF with 2 days of flank pain, fever 101.3.  seen in UC yesterday and given IM rocephin. returns to ED with persistent fever and flank pain.  WBC 17.8. no blood cx obtained. urine cx obtained. CT abd shows right pyelonephritis Going to: [facility] Admission Status: inpatient Bed Type: med/surg To Do:  [facility] will assume care on arrival to accepting facility. Until arrival, medical decision making responsibilities remain with the EDP.  However, [facility] available 24/7 for questions and assistance.   Nursing staff please page [facility] Admits and Consults [phone]) as soon as the patient arrives to the hospital.  Carollee Herter, DO [facility]

## 2023-04-07 NOTE — ED Notes (Signed)
Attempted to call report to [facility] [place], no answer. Will call back.

## 2023-04-07 NOTE — ED Notes (Signed)
Called [facility] to transport patient to [facility] [phone]

## 2023-04-07 NOTE — ED Provider Notes (Signed)
Michelle Duarte  Provider Note  CSN: [id number] Arrival date & time: 04/07/23 0235  History Chief Complaint  Patient presents with   Flank Pain    Michelle Duarte is a 42 y.o. female with history of HTN reports one day of fever, R sided flank and abdominal pain. She was seen at [facility] in the afternoon of 5/28 and diagnosed with UTI although UA was not particularly convincing for infection. Given IM rocephin, Rx for Bactrim and advised to come to the ED if symptoms worsened. She reports fever returned this evening and pain became worse. Pain is really more anterior, associated with nausea and anorexia. No vomiting or diarrhea. Has not had any dysuria, frequency, hematuria, or vaginal discharge.    Home Medications Prior to Admission medications   Medication Sig Start Date End Date Taking? Authorizing Provider  amLODipine (NORVASC) 5 MG tablet Take 1 tablet by mouth daily. 03/02/16   [provider]  benzonatate (TESSALON) 100 MG capsule Take 1 capsule (100 mg total) by mouth every 8 (eight) hours. 07/20/22   Immordino, Jeannett Senior, FNP  BIOTIN PO Take 2 tablets by mouth daily.     [provider]  doxycycline (VIBRAMYCIN) 100 MG capsule Take 1 capsule (100 mg total) by mouth 2 (two) times daily. 04/20/22   Gustavus Bryant, FNP  famotidine (PEPCID) 20 MG tablet Take 1 tablet (20 mg total) by mouth 2 (two) times daily. 05/08/20   Bethann Berkshire, MD  fluconazole (DIFLUCAN) 150 MG tablet Take 1 tablet (150 mg total) by mouth daily. Take at first sign of vaginal yeast. 04/06/23   Gustavus Bryant, FNP  furosemide (LASIX) 40 MG tablet Take 1 tablet (40 mg total) by mouth daily. 08/28/21   Roemhildt, Lorin T, PA-C  irbesartan (AVAPRO) 300 MG tablet Take 300 mg by mouth daily. Patient not taking: Reported on 05/08/2020    [provider]  methylPREDNISolone (MEDROL DOSEPAK) 4 MG TBPK tablet Steroid taper. Take as directed by packaging. 07/20/22    Immordino, Jeannett Senior, FNP  metroNIDAZOLE (FLAGYL) 500 MG tablet Take 1 tablet (500 mg total) by mouth 2 (two) times daily. 04/22/22   Merrilee Jansky, MD  Multiple Vitamin (MULTIVITAMIN) tablet Take 1 tablet by mouth daily.     [provider]  omeprazole (PRILOSEC) 40 MG capsule Take by mouth. 01/16/20   [provider]  potassium chloride (K-DUR) 10 MEQ tablet Take 4 tablets (40 mEq total) by mouth daily. Patient not taking: Reported on 05/08/2020 05/15/16   Avel Peace, MD  potassium chloride SA (KLOR-CON) 20 MEQ tablet Take 1 tablet (20 mEq total) by mouth 2 (two) times daily for 3 days. 08/28/21 08/31/21  Roemhildt, Lorin T, PA-C  sulfamethoxazole-trimethoprim (BACTRIM DS) 800-160 MG tablet Take 1 tablet by mouth 2 (two) times daily for 7 days. 04/06/23 04/13/23  Gustavus Bryant, FNP  Telmisartan-amLODIPine 40-10 MG TABS Take 1 tablet by mouth daily. 07/09/22   [provider]  ursodiol (ACTIGALL) 300 MG capsule Take 300 mg by mouth 2 (two) times daily. 04/30/20   [provider]     Allergies    Benadryl [diphenhydramine hcl]   Review of Systems   Review of Systems Please see HPI for pertinent positives and negatives  Physical Exam BP 135/81 (BP Location: Right Arm)   Pulse (!) 101   Temp (!) 101.3 F (38.5 C)   Resp 20   LMP 03/16/2023 (Within Days)   SpO2 99%  Physical Exam Vitals and nursing note reviewed.  Constitutional:      Appearance: Normal appearance.  HENT:     Head: Normocephalic and atraumatic.     Nose: Nose normal.     Mouth/Throat:     Mouth: Mucous membranes are moist.  Eyes:     Extraocular Movements: Extraocular movements intact.     Conjunctiva/sclera: Conjunctivae normal.  Cardiovascular:     Rate and Rhythm: Normal rate.  Pulmonary:     Effort: Pulmonary effort is normal.     Breath sounds: Normal breath sounds.  Abdominal:     General: Abdomen is flat.     Palpations: Abdomen is soft.     Tenderness: There is  abdominal tenderness in the right lower quadrant and left lower quadrant. There is guarding. Positive signs include Rovsing's sign and McBurney's sign. Negative signs include Murphy's sign.  Musculoskeletal:        General: No swelling. Normal range of motion.     Cervical back: Neck supple.  Skin:    General: Skin is warm and dry.  Neurological:     General: No focal deficit present.     Mental Status: She is alert.  Psychiatric:        Mood and Affect: Mood normal.     ED Results / Procedures / Treatments   EKG None  Procedures Procedures  Medications Ordered in the ED Medications  fentaNYL (SUBLIMAZE) injection 50 mcg (50 mcg Intravenous Given 04/07/23 0307)  ondansetron (ZOFRAN) injection 4 mg (4 mg Intravenous Given 04/07/23 0307)  sodium chloride 0.9 % bolus 1,000 mL (0 mLs Intravenous Stopped 04/07/23 0411)  potassium chloride SA (KLOR-CON M) CR tablet 40 mEq (40 mEq Oral Given 04/07/23 0410)  iohexol (OMNIPAQUE) 300 MG/ML solution 100 mL (85 mLs Intrathecal Contrast Given 04/07/23 0354)  cefTRIAXone (ROCEPHIN) 1 g in sodium chloride 0.9 % 100 mL IVPB (0 g Intravenous Stopped 04/07/23 0600)  HYDROcodone-acetaminophen (NORCO/VICODIN) 5-325 MG per tablet 2 tablet (2 tablets Oral Given 04/07/23 0523)    Initial Impression and Plan  Patient here with fever and R abdominal pain. Mostly tender in lower abdomen. Will check labs, send for CT to eval source of fever. Pain/nausea meds for comfort.   ED Course   Clinical Course as of 04/07/23 0622  Wed Apr 07, 2023  0317 CBC with leukocytosis.  [CS]  0320 UA with WBC and RBC, consistent with UTI, also some crystals so may have renal stone. CT is pending.  [CS]  0326 HCG is neg.  [CS]  0342 CMP with hypokalemia, otherwise normal. Will replete orally. Lipase is normal.  [CS]  0504 I personally viewed the images from radiology studies and agree with radiologist interpretation:  CT is consistent with pyelonephritis. Will give additional  rocephin, had 1gram about 12hours ago.  [CS]  [id number] Patient still having pain. Will give Norco while getting Abx then will need to decide inpatient vs outpatient management.  [CS]  0621 Spoke with Dr. Imogene Burn, Hospitalist, who will accept for admission.  [CS]    Clinical Course User Index [CS] Pollyann Savoy, MD     MDM Rules/Calculators/A&P Medical Decision Making Problems Addressed: Pyelonephritis: acute illness or injury  Amount and/or Complexity of Data Reviewed Labs: ordered. Decision-making details documented in ED Course. Radiology: ordered and independent interpretation performed. Decision-making details documented in ED Course.  Risk Prescription drug management. Parenteral controlled substances. Decision regarding hospitalization.     Final Clinical Impression(s) / ED Diagnoses Final diagnoses:  Pyelonephritis    Rx / DC Orders ED Discharge Orders     None        Pollyann Savoy, MD 04/07/23 [phone]

## 2023-04-08 DIAGNOSIS — N12 Tubulo-interstitial nephritis, not specified as acute or chronic: Secondary | ICD-10-CM | POA: Diagnosis not present

## 2023-04-08 LAB — COMPREHENSIVE METABOLIC PANEL
ALT: 20 U/L (ref 0–44)
AST: 16 U/L (ref 15–41)
Albumin: 2.8 g/dL — ABNORMAL LOW (ref 3.5–5.0)
Alkaline Phosphatase: 67 U/L (ref 38–126)
Anion gap: 6 (ref 5–15)
BUN: 12 mg/dL (ref 6–20)
CO2: 23 mmol/L (ref 22–32)
Calcium: 8.1 mg/dL — ABNORMAL LOW (ref 8.9–10.3)
Chloride: 105 mmol/L (ref 98–111)
Creatinine, Ser: 0.92 mg/dL (ref 0.44–1.00)
GFR, Estimated: >60 mL/min (ref >60)
Glucose, Bld: 92 mg/dL (ref 70–99)
Potassium: 3.7 mmol/L (ref 3.5–5.1)
Sodium: 134 mmol/L — ABNORMAL LOW (ref 135–145)
Total Bilirubin: 0.5 mg/dL (ref 0.3–1.2)
Total Protein: 6.6 g/dL (ref 6.5–8.1)

## 2023-04-08 LAB — HIV ANTIBODY (ROUTINE TESTING W REFLEX): HIV Screen 4th Generation wRfx: NONREACTIVE

## 2023-04-08 LAB — CBC
HCT: 34.5 % — ABNORMAL LOW (ref 36.0–46.0)
Hemoglobin: 11.1 g/dL — ABNORMAL LOW (ref 12.0–15.0)
MCH: 26.6 pg (ref 26.0–34.0)
MCHC: 32.2 g/dL (ref 30.0–36.0)
MCV: 82.7 fL (ref 80.0–100.0)
Platelets: 178 10*3/uL (ref 150–400)
RBC: 4.17 MIL/uL (ref 3.87–5.11)
RDW: 16.4 % — ABNORMAL HIGH (ref 11.5–15.5)
WBC: 16.1 10*3/uL — ABNORMAL HIGH (ref 4.0–10.5)
nRBC: 0 % (ref 0.0–0.2)

## 2023-04-08 LAB — PHOSPHORUS: Phosphorus: 3 mg/dL (ref 2.5–4.6)

## 2023-04-08 LAB — URINE CULTURE: Culture: >=100000 — AB

## 2023-04-08 LAB — MAGNESIUM: Magnesium: 1.9 mg/dL (ref 1.7–2.4)

## 2023-04-08 MED ORDER — OXYCODONE HCL 5 MG PO TABS
5.0000 mg | ORAL_TABLET | ORAL | Status: AC | PRN
  Administered 2023-04-08: 5 mg via ORAL
  Filled 2023-04-08: qty 1

## 2023-04-08 MED ORDER — PANTOPRAZOLE SODIUM 40 MG PO TBEC
40.0000 mg | DELAYED_RELEASE_TABLET | Freq: Every day | ORAL | Status: DC
  Administered 2023-04-08 – 2023-04-09 (×2): 40 mg via ORAL
  Filled 2023-04-08 (×2): qty 1

## 2023-04-08 MED ORDER — IBUPROFEN 200 MG PO TABS
600.0000 mg | ORAL_TABLET | ORAL | Status: DC | PRN
  Filled 2023-04-08: qty 3

## 2023-04-08 MED ORDER — POTASSIUM CHLORIDE IN NACL 20-0.9 MEQ/L-% IV SOLN
Freq: Once | INTRAVENOUS | Status: AC
  Filled 2023-04-08: qty 1000

## 2023-04-08 MED ORDER — SUCRALFATE 1 GM/10ML PO SUSP
1.0000 g | Freq: Three times a day (TID) | ORAL | Status: DC
  Administered 2023-04-08 (×2): 1 g via ORAL
  Filled 2023-04-08 (×7): qty 10

## 2023-04-08 MED ORDER — PIPERACILLIN-TAZOBACTAM 3.375 G IVPB
3.3750 g | Freq: Three times a day (TID) | INTRAVENOUS | Status: DC
  Administered 2023-04-08 – 2023-04-09 (×3): 3.375 g via INTRAVENOUS
  Filled 2023-04-08 (×3): qty 50

## 2023-04-08 NOTE — Plan of Care (Signed)
  Problem: Education: Goal: Knowledge of General Education information will improve Description: Including pain rating scale, medication(s)/side effects and non-pharmacologic comfort measures Outcome: Progressing   Problem: Health Behavior/Discharge Planning: Goal: Ability to manage health-related needs will improve Outcome: Progressing   Problem: Clinical Measurements: Goal: Ability to maintain clinical measurements within normal limits will improve Outcome: Progressing Goal: Will remain free from infection Outcome: Progressing Goal: Diagnostic test results will improve Outcome: Progressing   Problem: Coping: Goal: Level of anxiety will decrease Outcome: Progressing   Problem: Pain Managment: Goal: General experience of comfort will improve Outcome: Progressing   Problem: Safety: Goal: Ability to remain free from injury will improve Outcome: Progressing   Problem: Skin Integrity: Goal: Risk for impaired skin integrity will decrease Outcome: Progressing   

## 2023-04-08 NOTE — Progress Notes (Signed)
Pharmacy Antibiotic Note  Michelle Duarte is a 42 y.o. female who presented to the ED on 04/07/2023 with c/o right sided flank pain, dysuria and fever.Abdominal CT on 04/07/23 showed acute pyelonephritis and left adrenal mass. UCX collected on 04/06/23 resulted back with Ecoli that's only sensitive to imipenem, zosyn and bactrim.  Pharmacy has been consulted to change ceftriaxone to zosyn for infection.  Plan: - zosyn 3.375 gm IV q8h (infuse over 4 hrs) - With stable renal function, pharmacy will sign off for abx consult.  Reconsult [place] if need further assistance.  ___________________________________________  Height: 5\' 3"  (160 cm) Weight: 86.6 kg (190 lb 14.7 oz) IBW/kg (Calculated) : 52.4  Temp (24hrs), Avg:98.7 F (37.1 C), Min:97.5 F (36.4 C), Max:101 F (38.3 C)  Recent Labs  Lab 04/06/23 1825 04/07/23 0309 04/08/23 0410  WBC 17.9* 17.8* 16.1*  CREATININE 0.93 0.99 0.92    Estimated Creatinine Clearance: 83.1 mL/min (by C-G formula based on SCr of 0.92 mg/dL).    Allergies  Allergen Reactions   Benadryl [Diphenhydramine Hcl] Shortness Of Breath, Itching and Nausea And Vomiting    IV form     Thank you for allowing pharmacy to be a part of this patient's care.  Lucia Gaskins 04/08/2023 9:44 AM

## 2023-04-08 NOTE — Progress Notes (Signed)
Called phlebotomist, notified of STAT order for BCXs. Requested immediate assistance.

## 2023-04-08 NOTE — Progress Notes (Signed)
Transition of Care [facility]) - Inpatient Brief Assessment   Patient Details  Name: Michelle Duarte MRN: [id number] Date of Birth: Aug 17, 1981  [facility] of [facility] CM/SW Contact:    Adrian Prows, RN Phone Number: 04/08/2023, 1:12 PM   Clinical Narrative:    Transition of Care Asessment: Insurance and Status: Insurance coverage has been reviewed Patient has primary care physician: Yes Home environment has been reviewed: home w/children Prior level of function:: Independent Prior/Current Home Services: No current home services Social Determinants of Health Reivew: SDOH reviewed no interventions necessary Readmission risk has been reviewed: Yes Transition of care needs: no transition of care needs at this time

## 2023-04-08 NOTE — Progress Notes (Signed)
PROGRESS NOTE    Michelle Duarte  JXB:[id number] DOB: 1981/07/07 DOA: 04/07/2023 PCP: [facility]   Brief Narrative:  Michelle Duarte is a 42 y.o. female with medical history significant of abnormal Pap smear, postpartum depression, bilateral breast reduction surgery, hypertension, hypokalemia, iron deficiency anemia, sleeve gastrectomy, elevated BP without diagnosis of hypertension who presented to the emergency department with right-sided flank pain associated with fever and dysuria since Monday.   Assessment & Plan:   Principal Problem:   Pyelonephritis of right kidney Active Problems:   Hypokalemia   Hyperglycemia   Adrenal adenoma, left    Sepsis secondary to pyelonephritis of right kidney -Preliminary cultures concerning for ESBL although less than 10,000 colonies, given patient remains febrile overnight despite ceftriaxone will escalate to Zosyn per sensitivities. -Likely transition to Bactrim in the next 24 hours to complete antibiotic course -Wean narcotics as appropriate -Continue antiemetics -Blood cultures pending  Hypokalemia -Repleted, follow repeat labs   Hyperglycemia Questionably reactive, well-controlled on fasting labs   Adrenal adenoma, left Discussed at length with the patient. Advised to follow-up with PCP within a few weeks to discuss further imaging and workup.  DVT prophylaxis: Lovenox Code Status: Full Family Communication: At bedside  Status is: Inpatient  Dispo: The patient is from: Home              Anticipated d/c is to: Home              Anticipated d/c date is: 24 to 48 hours              Patient currently not medically stable for discharge  Consultants:  None  Procedures:  None  Antimicrobials:  Zosyn(ceftriaxone discontinued)  Subjective: No acute issues or events overnight, clinically improving denies nausea vomiting diarrhea constipation fevers chills or chest pain.  Denies overt urinary symptoms of hesitancy  frequency or dysuria.  Objective: Vitals:   04/07/23 1700 04/07/23 2048 04/07/23 2135 04/08/23 0357  BP:  124/84  125/88  Pulse:  86  83  Resp:  18  16  Temp:  (!) 97.5 F (36.4 C)  (!) 101 F (38.3 C)  TempSrc:      SpO2:  98%  98%  Weight:   86.6 kg   Height: 5\' 3"  (1.6 m)       Intake/Output Summary (Last 24 hours) at 04/08/2023 0743 Last data filed at 04/08/2023 0738 Gross per 24 hour  Intake 2584.58 ml  Output --  Net 2584.58 ml   Filed Weights   04/07/23 2135  Weight: 86.6 kg    Examination:  General exam: Appears calm and comfortable  Respiratory system: Clear to auscultation. Respiratory effort normal. Cardiovascular system: S1 & S2 heard, RRR. No JVD, murmurs, rubs, gallops or clicks. No pedal edema. Gastrointestinal system: Abdomen is nondistended, soft and nontender. No organomegaly or masses felt. Normal bowel sounds heard. Central nervous system: Alert and oriented. No focal neurological deficits. Extremities: Symmetric 5 x 5 power. Skin: No rashes, lesions or ulcers Psychiatry: Judgement and insight appear normal. Mood & affect appropriate.     Data Reviewed: I have personally reviewed following labs and imaging studies  CBC: Recent Labs  Lab 04/06/23 1825 04/07/23 0309 04/08/23 0410  WBC 17.9* 17.8* 16.1*  NEUTROABS  --  16.4*  --   HGB 12.6 12.6 11.1*  HCT 38.5 38.1 34.5*  MCV 83 81.2 82.7  PLT 218 205 178   Basic Metabolic Panel: Recent Labs  Lab 04/06/23 1825  04/07/23 0309 04/08/23 0410  NA 137 136 134*  K 3.1* 2.9* 3.7  CL 101 102 105  CO2 21 26 23   GLUCOSE 92 118* 92  BUN 12 19 12   CREATININE 0.93 0.99 0.92  CALCIUM 8.9 9.1 8.1*  MG  --   --  1.9  PHOS  --   --  3.0   GFR: Estimated Creatinine Clearance: 83.1 mL/min (by C-G formula based on SCr of 0.92 mg/dL). Liver Function Tests: Recent Labs  Lab 04/07/23 0309 04/08/23 0410  AST 19 16  ALT 16 20  ALKPHOS 64 67  BILITOT 0.4 0.5  PROT 6.9 6.6  ALBUMIN 3.6 2.8*    Recent Labs  Lab 04/07/23 0309  LIPASE 18   No results for input(s): "AMMONIA" in the last 168 hours. Coagulation Profile: No results for input(s): "INR", "PROTIME" in the last 168 hours. Cardiac Enzymes: No results for input(s): "CKTOTAL", "CKMB", "CKMBINDEX", "TROPONINI" in the last 168 hours. BNP (last 3 results) No results for input(s): "PROBNP" in the last 8760 hours. HbA1C: No results for input(s): "HGBA1C" in the last 72 hours. CBG: No results for input(s): "GLUCAP" in the last 168 hours. Lipid Profile: No results for input(s): "CHOL", "HDL", "LDLCALC", "TRIG", "CHOLHDL", "LDLDIRECT" in the last 72 hours. Thyroid Function Tests: No results for input(s): "TSH", "T4TOTAL", "FREET4", "T3FREE", "THYROIDAB" in the last 72 hours. Anemia Panel: No results for input(s): "VITAMINB12", "FOLATE", "FERRITIN", "TIBC", "IRON", "RETICCTPCT" in the last 72 hours. Sepsis Labs: No results for input(s): "PROCALCITON", "LATICACIDVEN" in the last 168 hours.  Recent Results (from the past 240 hour(s))  Urine Culture     Status: Abnormal (Preliminary result)   Collection Time: 04/06/23  6:25 PM   Specimen: Urine, Random  Result Value Ref Range Status   Specimen Description URINE, RANDOM  Final   Special Requests NONE  Final   Culture (A)  Final    >=100,000 COLONIES/mL GRAM NEGATIVE RODS IDENTIFICATION AND SUSCEPTIBILITIES TO FOLLOW Performed at [facility], [address], [place], [place] [address]    Report Status PENDING  Incomplete  Urine Culture     Status: None (Preliminary result)   Collection Time: 04/07/23  3:09 AM   Specimen: Urine, Clean Catch  Result Value Ref Range Status   Specimen Description   Final    URINE, CLEAN CATCH Performed at [facility], [address] [place], [place] [address]    Special Requests   Final    NONE Reflexed from [id number] Performed at [facility], [address] [place], [place] [address]    Culture  PENDING  Incomplete   Report Status PENDING  Incomplete         Radiology Studies: CT ABDOMEN PELVIS W CONTRAST  Result Date: 04/07/2023 CLINICAL DATA:  42 year old female with right flank pain, right lower quadrant pain for 2 days. Recently treated for kidney infection. EXAM: CT ABDOMEN AND PELVIS WITH CONTRAST TECHNIQUE: Multidetector CT imaging of the abdomen and pelvis was performed using the standard protocol following bolus administration of intravenous contrast. RADIATION DOSE REDUCTION: This exam was performed according to the departmental dose-optimization program which includes automated exposure control, adjustment of the mA and/or kV according to patient size and/or use of iterative reconstruction technique. CONTRAST:  85mL OMNIPAQUE IOHEXOL 300 MG/ML  SOLN COMPARISON:  Renal ultrasound 06/17/2016. FINDINGS: Lower chest: Cardiac size at the upper limits of normal. No pericardial or pleural effusion. Mild lung base opacity in the lower lobes most resembling  scarring or atelectasis. Hepatobiliary: Liver and gallbladder are within normal limits. Pancreas: Within normal limits. Spleen: Within normal limits. Adrenals/Urinary Tract: Round left adrenal nodule measures 2.1 cm, and has density of 43 Hounsfield units. Contralateral right adrenal gland is within normal limits. No delayed renal excretory images. On these nephrographic phase images there is asymmetric heterogeneous enhancement of the right kidney (coronal image 38) and asymmetric right pararenal space inflammation. Furthermore, there is urothelial thickening of the right ureteropelvic junction on coronal image 52. Left renal enhancement and collecting system appear within normal limits. No nephrolithiasis identified. No hydroureter. Incidental pelvic phleboliths. Bladder remains within normal limits. Stomach/Bowel: No dilated large or small bowel. Mild diverticulosis of the sigmoid colon. Appendix remains normal (coronal image 53 and  series 2, image 57, 59) tracking to the midline. Occasional diverticula of the cecum. No dilated small bowel. Postoperative changes to the stomach suggesting previous gastric sleeve. No definite gastrojejunostomy. Stomach and duodenum are decompressed. Vascular/Lymphatic: Faint calcified atherosclerosis at the aortoiliac bifurcation. Otherwise major arterial structures in the abdomen and pelvis appear patent and normal. Portal venous system is patent. No lymphadenopathy. Reproductive: Evidence of dislodged bilateral tubal ligation clips. One clip is located in the ventral omentum now near the level of the umbilicus series 2, image 47. A 2nd clip is in the cul-de-sac on the right. Otherwise within normal limits. Other: No pelvis free fluid. Musculoskeletal: Chronic right L5 pars fracture, superimposed on spina bifida occulta at that level. No significant L5-S1 spondylolisthesis, but chronic disc and facet degeneration there. No acute osseous abnormality identified. IMPRESSION: 1. Abnormal Right kidney with evidence of Acute Pyelonephritis, urothelial inflammation. No urinary calculus or obstructive uropathy. 2. Left adrenal mass measuring 2.1 cm, probable benign adenoma. Recommend adrenal washout CT or chemical shift MRI. JACR 2017 Aug; 14(8):1038-44, JCAT 2016 Mar-Apr; 40(2):194-200, Urol J 2006 Spring; 3(2):71-4. 3. No other acute or inflammatory process identified in the abdomen or pelvis. But evidence of dislodged bilateral tubal ligation clips. Electronically Signed   By: Odessa Fleming M.D.   On: 04/07/2023 05:00    Scheduled Meds:  amLODipine  10 mg Oral Daily   enoxaparin (LOVENOX) injection  40 mg Subcutaneous Q24H   irbesartan  150 mg Oral Daily   Continuous Infusions:  0.9 % NaCl with KCl 20 mEq / L Stopped (04/08/23 0641)   cefTRIAXone (ROCEPHIN)  IV 1 g (04/08/23 0437)     LOS: 1 day   Time spent:  Azucena Fallen, DO [facility]  If 7PM-7AM, please contact  night-coverage [email or web address]  04/08/2023, 7:43 AM

## 2023-04-09 ENCOUNTER — Other Ambulatory Visit: Payer: Self-pay

## 2023-04-09 DIAGNOSIS — N12 Tubulo-interstitial nephritis, not specified as acute or chronic: Secondary | ICD-10-CM | POA: Diagnosis not present

## 2023-04-09 LAB — CULTURE, BLOOD (ROUTINE X 2): Culture: NO GROWTH

## 2023-04-09 MED ORDER — SULFAMETHOXAZOLE-TRIMETHOPRIM 800-160 MG PO TABS: 1.0000 | ORAL_TABLET | Freq: Two times a day (BID) | ORAL | 0 refills | Status: DC

## 2023-04-09 MED ORDER — SULFAMETHOXAZOLE-TRIMETHOPRIM 800-160 MG PO TABS
1.0000 | ORAL_TABLET | Freq: Two times a day (BID) | ORAL | Status: DC
  Administered 2023-04-09: 1 via ORAL
  Filled 2023-04-09: qty 1

## 2023-04-09 NOTE — Discharge Summary (Signed)
Physician Discharge Summary  Michelle Duarte ZOX:[id number] DOB: November 05, 1981 DOA: 04/07/2023  PCP: [facility]  Admit date: 04/07/2023 Discharge date: 04/09/2023  Admitted From: Home Disposition: Home  Recommendations for Outpatient Follow-up:  Follow up with PCP in 1-2 weeks  Home Health: None Equipment/Devices: None  Discharge Condition: Stable CODE STATUS: Full Diet recommendation: Regular diet as tolerated  Brief/Interim Summary: Michelle Duarte is a 42 y.o. female with medical history significant of abnormal Pap smear, postpartum depression, bilateral breast reduction surgery, hypertension, hypokalemia, iron deficiency anemia, sleeve gastrectomy, elevated BP without diagnosis of hypertension who presented to the emergency department with right-sided flank pain associated with fever and dysuria since Monday.   Patient admitted as above with flank pain, imaging notable for pyelonephritis, urine culture shows E. coli with moderate resistance pattern.  Initial ceftriaxone transition to Zosyn with no further episodes of fever, leukocytosis or symptoms.  Transition to Bactrim given limited p.o. options.  Recommend close follow-up with PCP next week to ensure resolution of symptoms after completion of antibiotics.  Discharge Diagnoses:  Principal Problem:   Pyelonephritis of right kidney Active Problems:   Hypokalemia   Hyperglycemia   Adrenal adenoma, left  Sepsis secondary to pyelonephritis of right kidney -E. coli, with concerning resistance pattern, complete Bactrim as above. -Continue antiemetics -Blood cultures pending -will follow outpatient, given marked improvement over the past 48 hours bacteremia is less likely but cannot be ruled out.   Hypokalemia -Resolved   Hyperglycemia Questionably reactive, well-controlled on fasting labs   Adrenal adenoma, left Discussed at length with the patient Per imaging - measuring 2.1 cm, probable benign adenoma.  Advised to  follow-up with PCP to discuss further imaging and workup.  Discharge Instructions   Allergies as of 04/09/2023       Reactions   Benadryl [diphenhydramine Hcl] Shortness Of Breath, Itching, Nausea And Vomiting   IV form        Medication List     STOP taking these medications    benzonatate 100 MG capsule Commonly known as: TESSALON   doxycycline 100 MG capsule Commonly known as: VIBRAMYCIN   famotidine 20 MG tablet Commonly known as: Pepcid   fluconazole 150 MG tablet Commonly known as: Diflucan   furosemide 40 MG tablet Commonly known as: LASIX   methylPREDNISolone 4 MG Tbpk tablet Commonly known as: MEDROL DOSEPAK   metroNIDAZOLE 500 MG tablet Commonly known as: FLAGYL   potassium chloride 10 MEQ tablet Commonly known as: KLOR-CON       TAKE these medications    Humira (2 Pen) 80 MG/0.8ML Pnkt Generic drug: Adalimumab Inject 80 mg into the skin.   multivitamin tablet Take 1 tablet by mouth daily.   potassium chloride SA 20 MEQ tablet Commonly known as: KLOR-CON M Take 1 tablet (20 mEq total) by mouth 2 (two) times daily for 3 days.   sulfamethoxazole-trimethoprim 800-160 MG tablet Commonly known as: BACTRIM DS Take 1 tablet by mouth every 12 (twelve) hours. What changed: when to take this   Telmisartan-amLODIPine 40-10 MG Tabs Take 1 tablet by mouth daily.        Allergies  Allergen Reactions   Benadryl [Diphenhydramine Hcl] Shortness Of Breath, Itching and Nausea And Vomiting    IV form    Consultations: None   Procedures/Studies: CT ABDOMEN PELVIS W CONTRAST  Result Date: 04/07/2023 CLINICAL DATA:  42 year old female with right flank pain, right lower quadrant pain for 2 days. Recently treated for kidney infection. EXAM: CT ABDOMEN  AND PELVIS WITH CONTRAST TECHNIQUE: Multidetector CT imaging of the abdomen and pelvis was performed using the standard protocol following bolus administration of intravenous contrast. RADIATION DOSE  REDUCTION: This exam was performed according to the departmental dose-optimization program which includes automated exposure control, adjustment of the mA and/or kV according to patient size and/or use of iterative reconstruction technique. CONTRAST:  85mL OMNIPAQUE IOHEXOL 300 MG/ML  SOLN COMPARISON:  Renal ultrasound 06/17/2016. FINDINGS: Lower chest: Cardiac size at the upper limits of normal. No pericardial or pleural effusion. Mild lung base opacity in the lower lobes most resembling scarring or atelectasis. Hepatobiliary: Liver and gallbladder are within normal limits. Pancreas: Within normal limits. Spleen: Within normal limits. Adrenals/Urinary Tract: Round left adrenal nodule measures 2.1 cm, and has density of 43 Hounsfield units. Contralateral right adrenal gland is within normal limits. No delayed renal excretory images. On these nephrographic phase images there is asymmetric heterogeneous enhancement of the right kidney (coronal image 38) and asymmetric right pararenal space inflammation. Furthermore, there is urothelial thickening of the right ureteropelvic junction on coronal image 52. Left renal enhancement and collecting system appear within normal limits. No nephrolithiasis identified. No hydroureter. Incidental pelvic phleboliths. Bladder remains within normal limits. Stomach/Bowel: No dilated large or small bowel. Mild diverticulosis of the sigmoid colon. Appendix remains normal (coronal image 53 and series 2, image 57, 59) tracking to the midline. Occasional diverticula of the cecum. No dilated small bowel. Postoperative changes to the stomach suggesting previous gastric sleeve. No definite gastrojejunostomy. Stomach and duodenum are decompressed. Vascular/Lymphatic: Faint calcified atherosclerosis at the aortoiliac bifurcation. Otherwise major arterial structures in the abdomen and pelvis appear patent and normal. Portal venous system is patent. No lymphadenopathy. Reproductive: Evidence of  dislodged bilateral tubal ligation clips. One clip is located in the ventral omentum now near the level of the umbilicus series 2, image 47. A 2nd clip is in the cul-de-sac on the right. Otherwise within normal limits. Other: No pelvis free fluid. Musculoskeletal: Chronic right L5 pars fracture, superimposed on spina bifida occulta at that level. No significant L5-S1 spondylolisthesis, but chronic disc and facet degeneration there. No acute osseous abnormality identified. IMPRESSION: 1. Abnormal Right kidney with evidence of Acute Pyelonephritis, urothelial inflammation. No urinary calculus or obstructive uropathy. 2. Left adrenal mass measuring 2.1 cm, probable benign adenoma. Recommend adrenal washout CT or chemical shift MRI. JACR 2017 Aug; 14(8):1038-44, JCAT 2016 Mar-Apr; 40(2):194-200, Urol J 2006 Spring; 3(2):71-4. 3. No other acute or inflammatory process identified in the abdomen or pelvis. But evidence of dislodged bilateral tubal ligation clips. Electronically Signed   By: Odessa Fleming M.D.   On: 04/07/2023 05:00     Subjective: No acute issues or events overnight   Discharge Exam: Vitals:   04/09/23 0934 04/09/23 1157  BP: 130/89 (!) 126/91  Pulse:  78  Resp:  17  Temp:  97.7 F (36.5 C)  SpO2:  99%   Vitals:   04/08/23 1935 04/09/23 0509 04/09/23 0934 04/09/23 1157  BP: 121/80 116/75 130/89 (!) 126/91  Pulse: 79 72  78  Resp: 18 16  17   Temp: 99.6 F (37.6 C) 99.1 F (37.3 C)  97.7 F (36.5 C)  TempSrc: Oral Oral  Oral  SpO2: 100% 97%  99%  Weight:      Height:        General: Pt is alert, awake, not in acute distress Cardiovascular: RRR, S1/S2 +, no rubs, no gallops Respiratory: CTA bilaterally, no wheezing, no rhonchi Abdominal: Soft, NT, ND,  bowel sounds + Extremities: no edema, no cyanosis    The results of significant diagnostics from this hospitalization (including imaging, microbiology, ancillary and laboratory) are listed below for reference.      Microbiology: Recent Results (from the past 240 hour(s))  Urine Culture     Status: Abnormal   Collection Time: 04/06/23  6:25 PM   Specimen: Urine, Random  Result Value Ref Range Status   Specimen Description URINE, RANDOM  Final   Special Requests   Final    NONE Performed at [facility], [address], [place], [place] [address]    Culture >=100,000 COLONIES/mL ESCHERICHIA COLI (A)  Final   Report Status 04/08/2023 FINAL  Final   Organism ID, Bacteria ESCHERICHIA COLI (A)  Final      Susceptibility   Escherichia coli - MIC*    AMPICILLIN >=32 RESISTANT Resistant     CEFAZOLIN >=64 RESISTANT Resistant     CEFEPIME 8 INTERMEDIATE Intermediate     CEFTRIAXONE >=64 RESISTANT Resistant     CIPROFLOXACIN >=4 RESISTANT Resistant     GENTAMICIN >=16 RESISTANT Resistant     IMIPENEM <=0.25 SENSITIVE Sensitive     NITROFURANTOIN 128 RESISTANT Resistant     TRIMETH/SULFA <=20 SENSITIVE Sensitive     AMPICILLIN/SULBACTAM >=32 RESISTANT Resistant     PIP/TAZO <=4 SENSITIVE Sensitive     * >=100,000 COLONIES/mL ESCHERICHIA COLI  Urine Culture     Status: Abnormal   Collection Time: 04/07/23  3:09 AM   Specimen: Urine, Clean Catch  Result Value Ref Range Status   Specimen Description   Final    URINE, CLEAN CATCH Performed at [facility], [address], [place], [place] [address]    Special Requests   Final    NONE Reflexed from [phone] [id number] Performed at [facility], [address] [place], [place] [address]    Culture (A)  Final    <10,000 COLONIES/mL INSIGNIFICANT GROWTH Performed at [facility], [address], [place], [place] [address]    Report Status 04/08/2023 FINAL  Final  Culture, blood (Routine X 2) w Reflex to ID Panel     Status: None (Preliminary result)   Collection Time: 04/08/23  8:12 AM   Specimen: BLOOD  Result Value Ref Range Status   Specimen Description   Final    BLOOD LEFT ANTECUBITAL Performed at [facility], [address], [place], [place] [address]    Special Requests   Final    BOTTLES DRAWN AEROBIC AND ANAEROBIC Blood Culture adequate volume Performed at [facility], [address], [place], [place] [address]    Culture   Final    NO GROWTH < 24 HOURS Performed at [facility], [address], [place], [place] [address]    Report Status PENDING  Incomplete  Culture, blood (Routine X 2) w Reflex to ID Panel     Status: None (Preliminary result)   Collection Time: 04/08/23  8:12 AM   Specimen: BLOOD  Result Value Ref Range Status   Specimen Description   Final    BLOOD BLOOD LEFT HAND Performed at [facility], [address] [place], [place] [address]    Special Requests   Final    BOTTLES DRAWN AEROBIC AND ANAEROBIC Blood Culture adequate volume Performed at [facility], [address] [place], [place] [address]    Culture   Final    NO GROWTH <  24 HOURS Performed at [facility], [address], [place], [place] [address]    Report Status PENDING  Incomplete     Labs: BNP (last 3 results) No results for input(s): "BNP" in the last 8760 hours. Basic Metabolic Panel: Recent Labs  Lab 04/06/23 1825 04/07/23 0309 04/08/23 0410  NA 137 136 134*  K 3.1* 2.9* 3.7  CL 101 102 105  CO2 21 26 23   GLUCOSE 92 118* 92  BUN 12 19 12   CREATININE 0.93 0.99 0.92  CALCIUM 8.9 9.1 8.1*  MG  --   --  1.9  PHOS  --   --  3.0   Liver Function Tests: Recent Labs  Lab 04/07/23 0309 04/08/23 0410  AST 19 16  ALT 16 20  ALKPHOS 64 67  BILITOT 0.4 0.5  PROT 6.9 6.6  ALBUMIN 3.6 2.8*   Recent Labs  Lab 04/07/23 0309  LIPASE 18   No results for input(s): "AMMONIA" in the last 168 hours. CBC: Recent Labs  Lab 04/06/23 1825 04/07/23 0309 04/08/23 0410  WBC 17.9* 17.8* 16.1*  NEUTROABS  --  16.4*  --   HGB 12.6 12.6 11.1*  HCT 38.5 38.1 34.5*  MCV 83 81.2 82.7  PLT 218 205 178    Cardiac Enzymes: No results for input(s): "CKTOTAL", "CKMB", "CKMBINDEX", "TROPONINI" in the last 168 hours. BNP: Invalid input(s): "POCBNP" CBG: No results for input(s): "GLUCAP" in the last 168 hours. D-Dimer No results for input(s): "DDIMER" in the last 72 hours. Hgb A1c No results for input(s): "HGBA1C" in the last 72 hours. Lipid Profile No results for input(s): "CHOL", "HDL", "LDLCALC", "TRIG", "CHOLHDL", "LDLDIRECT" in the last 72 hours. Thyroid function studies No results for input(s): "TSH", "T4TOTAL", "T3FREE", "THYROIDAB" in the last 72 hours.  Invalid input(s): "FREET3" Anemia work up No results for input(s): "VITAMINB12", "FOLATE", "FERRITIN", "TIBC", "IRON", "RETICCTPCT" in the last 72 hours. Urinalysis    Component Value Date/Time   COLORURINE YELLOW 04/07/2023 0309   APPEARANCEUR CLEAR 04/07/2023 0309   LABSPEC 1.013 04/07/2023 0309   PHURINE 7.0 04/07/2023 0309   GLUCOSEU NEGATIVE 04/07/2023 0309   HGBUR MODERATE (A) 04/07/2023 0309   BILIRUBINUR NEGATIVE 04/07/2023 0309   BILIRUBINUR negative 04/06/2023 1759   KETONESUR NEGATIVE 04/07/2023 0309   PROTEINUR TRACE (A) 04/07/2023 0309   UROBILINOGEN 0.2 04/06/2023 1759   UROBILINOGEN 0.2 07/18/2015 1834   NITRITE NEGATIVE 04/07/2023 0309   LEUKOCYTESUR SMALL (A) 04/07/2023 0309   Sepsis Labs Recent Labs  Lab 04/06/23 1825 04/07/23 0309 04/08/23 0410  WBC 17.9* 17.8* 16.1*   Microbiology Recent Results (from the past 240 hour(s))  Urine Culture     Status: Abnormal   Collection Time: 04/06/23  6:25 PM   Specimen: Urine, Random  Result Value Ref Range Status   Specimen Description URINE, RANDOM  Final   Special Requests   Final    NONE Performed at [facility], [address], [place], [place] [address]    Culture >=100,000 COLONIES/mL ESCHERICHIA COLI (A)  Final   Report Status 04/08/2023 FINAL  Final   Organism ID, Bacteria ESCHERICHIA COLI (A)  Final      Susceptibility    Escherichia coli - MIC*    AMPICILLIN >=32 RESISTANT Resistant     CEFAZOLIN >=64 RESISTANT Resistant     CEFEPIME 8 INTERMEDIATE Intermediate     CEFTRIAXONE >=64 RESISTANT Resistant     CIPROFLOXACIN >=4 RESISTANT Resistant     GENTAMICIN >=16 RESISTANT  Resistant     IMIPENEM <=0.25 SENSITIVE Sensitive     NITROFURANTOIN 128 RESISTANT Resistant     TRIMETH/SULFA <=20 SENSITIVE Sensitive     AMPICILLIN/SULBACTAM >=32 RESISTANT Resistant     PIP/TAZO <=4 SENSITIVE Sensitive     * >=100,000 COLONIES/mL ESCHERICHIA COLI  Urine Culture     Status: Abnormal   Collection Time: 04/07/23  3:09 AM   Specimen: Urine, Clean Catch  Result Value Ref Range Status   Specimen Description   Final    URINE, CLEAN CATCH Performed at [facility], [address] [place], [place] [address]    Special Requests   Final    NONE Reflexed from [phone] [id number] Performed at [facility], [address], [place], [place] [address]    Culture (A)  Final    <10,000 COLONIES/mL INSIGNIFICANT GROWTH Performed at [facility], [address], [place], [place] [address]    Report Status 04/08/2023 FINAL  Final  Culture, blood (Routine X 2) w Reflex to ID Panel     Status: None (Preliminary result)   Collection Time: 04/08/23  8:12 AM   Specimen: BLOOD  Result Value Ref Range Status   Specimen Description   Final    BLOOD LEFT ANTECUBITAL Performed at [facility], [address], [place], [place] [address]    Special Requests   Final    BOTTLES DRAWN AEROBIC AND ANAEROBIC Blood Culture adequate volume Performed at [facility], [address], [place], [place] [address]    Culture   Final    NO GROWTH < 24 HOURS Performed at [facility], [address] [place], [place] [address]    Report Status PENDING  Incomplete  Culture, blood (Routine X 2) w Reflex to ID Panel     Status: None (Preliminary result)   Collection Time: 04/08/23   8:12 AM   Specimen: BLOOD  Result Value Ref Range Status   Specimen Description   Final    BLOOD BLOOD LEFT HAND Performed at [facility], [address] [place], [place] [address]    Special Requests   Final    BOTTLES DRAWN AEROBIC AND ANAEROBIC Blood Culture adequate volume Performed at [facility], [address] [place], [place] [address]    Culture   Final    NO GROWTH < 24 HOURS Performed at [facility], [address] [place], [place] [address]    Report Status PENDING  Incomplete     Time coordinating discharge: Over 30 minutes  SIGNED:   Azucena Fallen, DO [facility] 04/09/2023, 1:35 PM Pager   If 7PM-7AM, please contact night-coverage [email or web address]

## 2023-04-09 NOTE — Plan of Care (Signed)
Patient awake and alert orient x4. Denies pain, nausea and vomiting. Remains on RA. Patient ready for discharge. No signs or symptoms of acute distress at the time of discharge. Patient verbalized discharge instructions.  Problem: Education: Goal: Knowledge of General Education information will improve Description: Including pain rating scale, medication(s)/side effects and non-pharmacologic comfort measures Outcome: Adequate for Discharge   Problem: Health Behavior/Discharge Planning: Goal: Ability to manage health-related needs will improve Outcome: Adequate for Discharge   Problem: Clinical Measurements: Goal: Ability to maintain clinical measurements within normal limits will improve Outcome: Adequate for Discharge Goal: Will remain free from infection Outcome: Adequate for Discharge Goal: Diagnostic test results will improve Outcome: Adequate for Discharge Goal: Respiratory complications will improve Outcome: Adequate for Discharge Goal: Cardiovascular complication will be avoided Outcome: Adequate for Discharge   Problem: Activity: Goal: Risk for activity intolerance will decrease Outcome: Adequate for Discharge   Problem: Nutrition: Goal: Adequate nutrition will be maintained Outcome: Adequate for Discharge   Problem: Coping: Goal: Level of anxiety will decrease Outcome: Adequate for Discharge   Problem: Elimination: Goal: Will not experience complications related to bowel motility Outcome: Adequate for Discharge Goal: Will not experience complications related to urinary retention Outcome: Adequate for Discharge   Problem: Pain Managment: Goal: General experience of comfort will improve Outcome: Adequate for Discharge   Problem: Safety: Goal: Ability to remain free from injury will improve Outcome: Adequate for Discharge   Problem: Skin Integrity: Goal: Risk for impaired skin integrity will decrease Outcome: Adequate for Discharge

## 2023-04-09 NOTE — Plan of Care (Signed)
  Problem: Health Behavior/Discharge Planning: Goal: Ability to manage health-related needs will improve Outcome: Progressing   Problem: Clinical Measurements: Goal: Ability to maintain clinical measurements within normal limits will improve Outcome: Progressing Goal: Will remain free from infection Outcome: Progressing   Problem: Nutrition: Goal: Adequate nutrition will be maintained Outcome: Progressing   Problem: Coping: Goal: Level of anxiety will decrease Outcome: Progressing   Problem: Elimination: Goal: Will not experience complications related to urinary retention Outcome: Progressing   Problem: Pain Managment: Goal: General experience of comfort will improve Outcome: Progressing   Problem: Safety: Goal: Ability to remain free from injury will improve Outcome: Progressing

## 2023-04-11 LAB — CULTURE, BLOOD (ROUTINE X 2)

## 2023-04-12 LAB — CULTURE, BLOOD (ROUTINE X 2): Culture: NO GROWTH

## 2023-04-13 LAB — CULTURE, BLOOD (ROUTINE X 2)
Special Requests: ADEQUATE
Special Requests: ADEQUATE

## 2023-05-24 ENCOUNTER — Ambulatory Visit
Admission: EM | Admit: 2023-05-24 | Discharge: 2023-05-24 | Disposition: A | Discharge status: Home / Self Care | Payer: PRIVATE HEALTH INSURANCE | Attending: Internal Medicine | Admitting: Internal Medicine

## 2023-05-24 ENCOUNTER — Ambulatory Visit: Payer: PRIVATE HEALTH INSURANCE

## 2023-05-24 ENCOUNTER — Encounter: Payer: Self-pay | Admitting: Emergency Medicine

## 2023-05-24 DIAGNOSIS — Z113 Encounter for screening for infections with a predominantly sexual mode of transmission: Secondary | ICD-10-CM | POA: Diagnosis not present

## 2023-05-24 DIAGNOSIS — N898 Other specified noninflammatory disorders of vagina: Secondary | ICD-10-CM | POA: Insufficient documentation

## 2023-05-24 LAB — POCT URINALYSIS DIP (MANUAL ENTRY)
Bilirubin, UA: NEGATIVE
Blood, UA: NEGATIVE
Glucose, UA: NEGATIVE mg/dL
Leukocytes, UA: NEGATIVE
Nitrite, UA: NEGATIVE
Protein Ur, POC: NEGATIVE mg/dL
Spec Grav, UA: 1.02 (ref 1.010–1.025)
Urobilinogen, UA: 0.2 E.U./dL
pH, UA: 6.5 (ref 5.0–8.0)

## 2023-05-24 LAB — POCT URINE PREGNANCY: Preg Test, Ur: NEGATIVE

## 2023-05-24 MED ORDER — METRONIDAZOLE 500 MG PO TABS: 500.0000 mg | ORAL_TABLET | Freq: Two times a day (BID) | ORAL | 0 refills | Status: DC

## 2023-05-24 MED ORDER — FLUCONAZOLE 150 MG PO TABS: 150.0000 mg | ORAL_TABLET | Freq: Every day | ORAL | 0 refills | Status: DC

## 2023-05-24 NOTE — Discharge Instructions (Addendum)
We will call you when the tests results are back  If the right lower quadrant pain gets worse, go to the ER

## 2023-05-24 NOTE — ED Provider Notes (Signed)
[facility]    CSN: [id number] Arrival date & time: 05/24/23  1341      History   Chief Complaint Chief Complaint  Patient presents with   SEXUALLY TRANSMITTED DISEASE    HPI Michelle Duarte is a 42 y.o. female who presents with new vaginal dicharge with odor and remends her of when she had BV. She would also like to have STD testing. She is in a monogamous relationship.  LMP 2-3 weeks ago. 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   Nonspecific abnormal electrocardiogram (ECG) (EKG) 10/04/2019   Preop cardiovascular exam 10/04/2019   Chronic hypertension in pregnancy 12/09/2015   Left hip pain 02/21/2015   BP (high blood pressure) 01/17/2015   Vaginal delivery 08/06/2011   CONSTIPATION 01/19/2008    Past Surgical History:  Procedure Laterality Date   BREAST SURGERY     reduction   DILATION AND CURETTAGE OF UTERUS     with polypectomy   LAPAROSCOPIC GASTRIC SLEEVE RESECTION     MASS EXCISION Right 05/15/2016   Procedure: REMOVAL OF RIGHT ARM MASS;  Surgeon: Avel Peace, MD;  Location: [facility];  Service: General;  Laterality: Right;   THERAPEUTIC ABORTION  2000   TUBAL LIGATION  02/01/2016   WISDOM TOOTH EXTRACTION      OB History     Gravida  4   Para  2   Term  2   Preterm      AB  2   Living  2      SAB  1   IAB  1    Ectopic      Multiple  0   Live Births  2            Home Medications    Prior to Admission medications   Medication Sig Start Date End Date Taking? Authorizing Provider  fluconazole (DIFLUCAN) 150 MG tablet Take 1 tablet (150 mg total) by mouth daily. 05/24/23  Yes Rodriguez-Southworth, Nettie Elm, PA-C  HUMIRA, 2 PEN, 80 MG/0.8ML PNKT Inject 80 mg into the skin.   Yes [provider]  metroNIDAZOLE (FLAGYL) 500 MG tablet Take 1 tablet (500 mg total) by mouth 2 (two) times daily. 05/24/23  Yes Rodriguez-Southworth, Nettie Elm, PA-C  Multiple Vitamin (MULTIVITAMIN) tablet Take 1 tablet by mouth daily.    Yes [provider]  Telmisartan-amLODIPine 40-10 MG TABS Take 1 tablet by mouth daily. 07/09/22  Yes [provider]    Family History Family History  Problem Relation Age of Onset   Hypertension Mother    Drug abuse Mother        recovering addict   Stroke Mother    Heart disease Mother    Hypertension Maternal Aunt    Diabetes Paternal Aunt    Hypertension Maternal  Grandmother    Diabetes Paternal Grandmother    Hypertension Brother     Social History Social History   Tobacco Use   Smoking status: Former    Types: Cigars    Quit date: 08/2019    Years since quitting: 3.7   Smokeless tobacco: Never   Tobacco comments:    1 black and MIld a day  Vaping Use   Vaping status: Never Used  Substance Use Topics   Alcohol use: No    Alcohol/week: 0.0 standard drinks of alcohol   Drug use: No     Allergies   Benadryl [diphenhydramine hcl]   Review of Systems Review of Systems  Gastrointestinal:  Positive for abdominal pain.       Has mild RLQ pain and suprapubic area with pain level of 3/10  Genitourinary:  Positive for pelvic pain and vaginal discharge. Negative for dysuria, frequency and urgency.     Physical Exam Triage Vital Signs ED Triage Vitals  Encounter Vitals Group     BP 05/24/23 1407 (!) 140/89     Systolic BP Percentile  --      Diastolic BP Percentile --      Pulse Rate 05/24/23 1407 (!) 57     Resp 05/24/23 1407 16     Temp 05/24/23 1407 99.3 F (37.4 C)     Temp Source 05/24/23 1407 Oral     SpO2 05/24/23 1407 100 %     Weight --      Height --      Head Circumference --      Peak Flow --      Pain Score 05/24/23 1409 3     Pain Loc --      Pain Education --      Exclude from Growth Chart --    No data found.  Updated Vital Signs BP (!) 140/89 (BP Location: Left Arm)   Pulse (!) 57   Temp 99.3 F (37.4 C) (Oral)   Resp 16   SpO2 100%   Visual Acuity Right Eye Distance:   Left Eye Distance:   Bilateral Distance:    Right Eye Near:   Left Eye Near:    Bilateral Near:     Physical Exam Vitals and nursing note reviewed.  Constitutional:      General: She is not in acute distress.    Appearance: She is not toxic-appearing.  HENT:     Right Ear: External ear normal.     Left Ear: External ear normal.  Eyes:     General: No scleral icterus.    Conjunctiva/sclera: Conjunctivae normal.  Pulmonary:     Effort: Pulmonary effort is normal.  Abdominal:     General: Bowel sounds are normal. There is no distension.     Palpations: Abdomen is soft.     Tenderness: There is abdominal tenderness. There is no guarding or rebound.     Comments: Has mild RLQ tenderness  Musculoskeletal:     Cervical back: Neck supple.  Neurological:     Mental Status: She is alert and oriented to person, place, and time.     Gait: Gait normal.  Psychiatric:        Mood and Affect: Mood normal.        Behavior: Behavior normal.        Thought Content: Thought content normal.        Judgment: Judgment normal.      UC Treatments / Results  Labs (  all labs ordered are listed, but only abnormal results are displayed) Labs Reviewed  POCT URINALYSIS DIP (MANUAL ENTRY) - Abnormal; Notable for the following components:      Result Value   Ketones, POC UA trace (5) (*)    All other components within  normal limits  POCT URINE PREGNANCY  CERVICOVAGINAL ANCILLARY ONLY    EKG   Radiology No results found.  Procedures Procedures (including critical care time)  Medications Ordered in UC Medications - No data to display  Initial Impression / Assessment and Plan / UC Course  I have reviewed the triage vital signs and the nursing notes.  Pertinent labs results that were available during my care of the patient were reviewed by me and considered in my medical decision making (see chart for details).  Vaginal discharge  Vaginal swab was sent out and we will call her when results are back if positive. In the mean time I placed her on flagyl as noted since she has had this before and is pretty sure she has it.   Final Clinical Impressions(s) / UC Diagnoses   Final diagnoses:  Vaginal discharge  Screen for STD (sexually transmitted disease)     Discharge Instructions      We will call you when the tests results are back  If the right lower quadrant pain gets worse, go to the ER     ED Prescriptions     Medication Sig Dispense Auth. Provider   metroNIDAZOLE (FLAGYL) 500 MG tablet Take 1 tablet (500 mg total) by mouth 2 (two) times daily. 14 tablet Rodriguez-Southworth, Bassheva Flury, PA-C   fluconazole (DIFLUCAN) 150 MG tablet Take 1 tablet (150 mg total) by mouth daily. 1 tablet Rodriguez-Southworth, Nettie Elm, PA-C      PDMP not reviewed this encounter.   Garey Ham, [place] 05/24/23 1445

## 2023-05-24 NOTE — ED Triage Notes (Addendum)
Patient c/o new vaginal discharge and odor x 3 days she believes may be BV and is requesting STD testing. Hx of having tubal ligation. LMP -3 weeks prior. Declines blood work. Also endorses some urinary hesitancy and lower abdominal discomfort.

## 2023-05-25 LAB — CERVICOVAGINAL ANCILLARY ONLY
Bacterial Vaginitis (gardnerella): POSITIVE — AB
Candida Glabrata: NEGATIVE
Candida Vaginitis: NEGATIVE
Chlamydia: NEGATIVE
Comment: NEGATIVE
Comment: NEGATIVE
Comment: NEGATIVE
Comment: NEGATIVE
Comment: NEGATIVE
Comment: NORMAL
Neisseria Gonorrhea: NEGATIVE
Trichomonas: NEGATIVE

## 2023-05-31 ENCOUNTER — Other Ambulatory Visit: Payer: Self-pay | Admitting: Urology

## 2023-05-31 DIAGNOSIS — E278 Other specified disorders of adrenal gland: Secondary | ICD-10-CM

## 2023-06-08 ENCOUNTER — Ambulatory Visit
Admission: RE | Admit: 2023-06-08 | Discharge: 2023-06-08 | Disposition: A | Discharge status: Home / Self Care | Payer: PRIVATE HEALTH INSURANCE | Source: Ambulatory Visit | Attending: Urology | Admitting: Urology

## 2023-06-08 DIAGNOSIS — E278 Other specified disorders of adrenal gland: Secondary | ICD-10-CM

## 2023-06-08 MED ORDER — IOPAMIDOL (ISOVUE-300) INJECTION 61%
125.0000 mL | Freq: Once | INTRAVENOUS | Status: AC | PRN
  Administered 2023-06-08: 125 mL via INTRAVENOUS

## 2023-10-30 ENCOUNTER — Telehealth: Payer: Self-pay | Admitting: Physician Assistant

## 2023-10-30 DIAGNOSIS — J069 Acute upper respiratory infection, unspecified: Secondary | ICD-10-CM

## 2023-10-30 MED ORDER — CETIRIZINE HCL 10 MG PO TABS: 10.0000 mg | ORAL_TABLET | Freq: Every day | ORAL | 0 refills | Status: AC

## 2023-10-30 MED ORDER — ALBUTEROL SULFATE HFA 108 (90 BASE) MCG/ACT IN AERS: 2.0000 | INHALATION_SPRAY | Freq: Four times a day (QID) | RESPIRATORY_TRACT | 0 refills | Status: DC | PRN

## 2023-10-30 MED ORDER — METHYLPREDNISOLONE 4 MG PO TBPK: ORAL_TABLET | ORAL | 0 refills | Status: DC

## 2023-10-30 MED ORDER — BENZONATATE 100 MG PO CAPS: 100.0000 mg | ORAL_CAPSULE | Freq: Two times a day (BID) | ORAL | 0 refills | Status: DC | PRN

## 2023-10-30 MED ORDER — GUAIFENESIN-DM 100-10 MG/5ML PO SYRP: 5.0000 mL | ORAL_SOLUTION | ORAL | 0 refills | Status: AC | PRN

## 2023-10-30 NOTE — Patient Instructions (Signed)
Michelle Duarte, thank you for joining Laure Kidney, PA-C for today's virtual visit.  While this provider is not your primary care provider (PCP), if your PCP is located in our provider database this encounter information will be shared with them immediately following your visit.   A [facility] MyChart account gives you access to today's visit and all your visits, tests, and labs performed at [facility] " click here if you don't have a  MyChart account or go to [email or web address]  Consent: (Patient) Michelle Duarte provided verbal consent for this virtual visit at the beginning of the encounter.  Current Medications:  Current Outpatient Medications:    albuterol (VENTOLIN HFA) 108 (90 Base) MCG/ACT inhaler, Inhale 2 puffs into the lungs every 6 (six) hours as needed for wheezing or shortness of breath., Disp: 8 g, Rfl: 0   benzonatate (TESSALON) 100 MG capsule, Take 1 capsule (100 mg total) by mouth 2 (two) times daily as needed for cough., Disp: 20 capsule, Rfl: 0   cetirizine (ZYRTEC ALLERGY) 10 MG tablet, Take 1 tablet (10 mg total) by mouth daily for 14 days., Disp: 14 tablet, Rfl: 0   guaiFENesin-dextromethorphan (ROBITUSSIN DM) 100-10 MG/5ML syrup, Take 5 mLs by mouth every 4 (four) hours as needed for cough., Disp: 118 mL, Rfl: 0   methylPREDNISolone (MEDROL DOSEPAK) 4 MG TBPK tablet, Take as directed on packaging., Disp: 1 each, Rfl: 0   fluconazole (DIFLUCAN) 150 MG tablet, Take 1 tablet (150 mg total) by mouth daily., Disp: 1 tablet, Rfl: 0   HUMIRA, 2 PEN, 80 MG/0.8ML PNKT, Inject 80 mg into the skin., Disp: , Rfl:    metroNIDAZOLE (FLAGYL) 500 MG tablet, Take 1 tablet (500 mg total) by mouth 2 (two) times daily., Disp: 14 tablet, Rfl: 0   Multiple Vitamin (MULTIVITAMIN) tablet, Take 1 tablet by mouth daily. , Disp: , Rfl:    Telmisartan-amLODIPine 40-10 MG TABS, Take 1 tablet by mouth daily., Disp: , Rfl:    Medications ordered in this encounter:  Meds  ordered this encounter  Medications   albuterol (VENTOLIN HFA) 108 (90 Base) MCG/ACT inhaler    Sig: Inhale 2 puffs into the lungs every 6 (six) hours as needed for wheezing or shortness of breath.    Dispense:  8 g    Refill:  0    Supervising Provider:   Merrilee Jansky [[id number]]   methylPREDNISolone (MEDROL DOSEPAK) 4 MG TBPK tablet    Sig: Take as directed on packaging.    Dispense:  1 each    Refill:  0    Supervising Provider:   Merrilee Jansky [[id number]]   guaiFENesin-dextromethorphan (ROBITUSSIN DM) 100-10 MG/5ML syrup    Sig: Take 5 mLs by mouth every 4 (four) hours as needed for cough.    Dispense:  118 mL    Refill:  0    Supervising Provider:   Merrilee Jansky [[id number]]   cetirizine (ZYRTEC ALLERGY) 10 MG tablet    Sig: Take 1 tablet (10 mg total) by mouth daily for 14 days.    Dispense:  14 tablet    Refill:  0    Supervising Provider:   Merrilee Jansky [[id number]]   benzonatate (TESSALON) 100 MG capsule    Sig: Take 1 capsule (100 mg total) by mouth 2 (two) times daily as needed for cough.    Dispense:  20 capsule    Refill:  0    Supervising Provider:   Merrilee Jansky [  [id number]]     *If you need refills on other medications prior to your next appointment, please contact your pharmacy*  Follow-Up: Call back or seek an in-person evaluation if the symptoms worsen or if the condition fails to improve as anticipated.  [facility] [phone]  Other Instructions Follow up with PCP in 2-3 days or the ER if symptoms are not improving.    If you have been instructed to have an in-person evaluation today at a local Urgent Care facility, please use the link below. It will take you to a list of all of our available [facility], including address, phone number and hours of operation. Please do not delay care.  [facility]  If you or a family member do not have a primary care provider, use the link below to schedule a visit  and establish care. When you choose a [facility] primary care physician or advanced practice provider, you gain a long-term partner in health. Find a Primary Care Provider  Learn more about [facility]'s in-office and virtual care options: [facility] - Get Care Now

## 2023-10-30 NOTE — Progress Notes (Signed)
Virtual Visit Consent   Michelle Duarte, you are scheduled for a virtual visit with a [facility] provider today. Just as with appointments in the office, your consent must be obtained to participate. Your consent will be active for this visit and any virtual visit you may have with one of our providers in the next 365 days. If you have a MyChart account, a copy of this consent can be sent to you electronically.  As this is a virtual visit, video technology does not allow for your provider to perform a traditional examination. This may limit your provider's ability to fully assess your condition. If your provider identifies any concerns that need to be evaluated in person or the need to arrange testing (such as labs, EKG, etc.), we will make arrangements to do so. Although advances in technology are sophisticated, we cannot ensure that it will always work on either your end or our end. If the connection with a video visit is poor, the visit may have to be switched to a telephone visit. With either a video or telephone visit, we are not always able to ensure that we have a secure connection.  By engaging in this virtual visit, you consent to the provision of healthcare and authorize for your insurance to be billed (if applicable) for the services provided during this visit. Depending on your insurance coverage, you may receive a charge related to this service.  I need to obtain your verbal consent now. Are you willing to proceed with your visit today? Michelle Duarte has provided verbal consent on 10/30/2023 for a virtual visit (video or telephone). Laure Kidney, [place]  Date: 10/30/2023 11:00 AM  Virtual Visit via Video Note   I, Laure Kidney, connected with  Michelle Duarte  ([id number], 10/04/1981) on 10/30/23 at 11:00 AM EST by a video-enabled telemedicine application and verified that I am speaking with the correct person using two identifiers.  Location: Patient: Virtual Visit Location Patient:  Home Provider: Virtual Visit Location Provider: Home Office   I discussed the limitations of evaluation and management by telemedicine and the availability of in person appointments. The patient expressed understanding and agreed to proceed.    History of Present Illness: Michelle Duarte is a 42 y.o. who identifies as a female who was assigned female at birth, and is being seen today for URI x 1 week after a cruise.  HPI: URI  This is a new problem. The current episode started in the past 7 days. The problem has been unchanged. There has been no fever. Associated symptoms include congestion, coughing, rhinorrhea and a sore throat. She has tried acetaminophen for the symptoms. The treatment provided mild relief.    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   Nonspecific abnormal electrocardiogram (ECG) (EKG) 10/04/2019   Preop cardiovascular exam 10/04/2019   Chronic hypertension in pregnancy 12/09/2015   Left hip pain 02/21/2015   BP (high blood pressure) 01/17/2015   Vaginal delivery 08/06/2011   CONSTIPATION 01/19/2008    Allergies:  Allergies  Allergen Reactions   Benadryl [Diphenhydramine Hcl] Shortness Of Breath, Itching and Nausea And Vomiting    IV form   Medications:  Current Outpatient Medications:    fluconazole (  DIFLUCAN) 150 MG tablet, Take 1 tablet (150 mg total) by mouth daily., Disp: 1 tablet, Rfl: 0   HUMIRA, 2 PEN, 80 MG/0.8ML PNKT, Inject 80 mg into the skin., Disp: , Rfl:    metroNIDAZOLE (FLAGYL) 500 MG tablet, Take 1 tablet (500 mg total) by mouth 2 (two) times daily., Disp: 14 tablet, Rfl: 0   Multiple Vitamin (MULTIVITAMIN) tablet, Take 1 tablet by mouth daily.  , Disp: , Rfl:    Telmisartan-amLODIPine 40-10 MG TABS, Take 1 tablet by mouth daily., Disp: , Rfl:   Observations/Objective: Patient is well-developed, well-nourished in no acute distress.  Resting comfortably  at home.  Head is normocephalic, atraumatic.  No labored breathing.  Speech is clear and coherent with logical content.  Patient is alert and oriented at baseline.    Assessment and Plan: 1. Upper respiratory tract infection, unspecified type (Primary)  patient presents with symptoms suspicious for likely viral upper respiratory infection. Differential includes bacterial pneumonia, sinusitis, allergic rhinitis. Do not suspect underlying cardiopulmonary process. I considered, but think unlikely, dangerous causes of this patient's symptoms to include ACS, CHF or COPD exacerbations, pneumonia, pneumothorax. Patient is nontoxic appearing and not in need of emergent medical intervention.  Plan: reassurance, reassessment, over the counter medications, discharge with PCP followup  Follow Up Instructions: I discussed the assessment and treatment plan with the patient. The patient was provided an opportunity to ask questions and all were answered. The patient agreed with the plan and demonstrated an understanding of the instructions.  A copy of instructions were sent to the patient via MyChart unless otherwise noted below.   The patient was advised to call back or seek an in-person evaluation if the symptoms worsen or if the condition fails to improve as anticipated.    Laure Kidney, PA-C

## 2023-12-20 ENCOUNTER — Telehealth: Payer: Self-pay | Admitting: Nurse Practitioner

## 2023-12-20 DIAGNOSIS — R6889 Other general symptoms and signs: Secondary | ICD-10-CM

## 2023-12-20 MED ORDER — PROMETHAZINE-DM 6.25-15 MG/5ML PO SYRP
5.0000 mL | ORAL_SOLUTION | Freq: Four times a day (QID) | ORAL | 0 refills | Status: AC | PRN
Start: 2023-12-20 — End: ?

## 2023-12-20 MED ORDER — OSELTAMIVIR PHOSPHATE 75 MG PO CAPS
75.0000 mg | ORAL_CAPSULE | Freq: Two times a day (BID) | ORAL | 0 refills | Status: AC
Start: 2023-12-20 — End: 2023-12-25

## 2023-12-20 NOTE — Progress Notes (Signed)
 Virtual Visit Consent   Michelle Duarte, you are scheduled for a virtual visit with a [facility] provider today. Just as with appointments in the office, your consent must be obtained to participate. Your consent will be active for this visit and any virtual visit you may have with one of our providers in the next 365 days. If you have a MyChart account, a copy of this consent can be sent to you electronically.  As this is a virtual visit, video technology does not allow for your provider to perform a traditional examination. This may limit your provider's ability to fully assess your condition. If your provider identifies any concerns that need to be evaluated in person or the need to arrange testing (such as labs, EKG, etc.), we will make arrangements to do so. Although advances in technology are sophisticated, we cannot ensure that it will always work on either your end or our end. If the connection with a video visit is poor, the visit may have to be switched to a telephone visit. With either a video or telephone visit, we are not always able to ensure that we have a secure connection.  By engaging in this virtual visit, you consent to the provision of healthcare and authorize for your insurance to be billed (if applicable) for the services provided during this visit. Depending on your insurance coverage, you may receive a charge related to this service.  I need to obtain your verbal consent now. Are you willing to proceed with your visit today? Michelle Duarte has provided verbal consent on 12/20/2023 for a virtual visit (video or telephone). Mardene Shake, FNP  Date: 12/20/2023 9:31 AM  Virtual Visit via Video Note   I, Mardene Shake, connected with  Michelle Duarte  ([id number], [birth year]) on 12/20/23 at  9:30 AM EST by a video-enabled telemedicine application and verified that I am speaking with the correct person using two identifiers.  Location: Patient: Virtual Visit Location Patient:  Home Provider: Virtual Visit Location Provider: Home Office   I discussed the limitations of evaluation and management by telemedicine and the availability of in person appointments. The patient expressed understanding and agreed to proceed.    History of Present Illness: Michelle Duarte is a 43 y.o. 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   Nonspecific abnormal electrocardiogram (ECG) (EKG) 10/04/2019   Preop cardiovascular exam 10/04/2019   Chronic hypertension in pregnancy 12/09/2015   Left hip pain 02/21/2015   BP (high blood pressure) 01/17/2015   Vaginal delivery 08/06/2011   CONSTIPATION 01/19/2008    Allergies:  Allergies  Allergen Reactions   Benadryl  [Diphenhydramine  Hcl] Shortness Of Breath, Itching and Nausea And Vomiting    IV form   Medications:  Current Outpatient Medications:    albuterol  (VENTOLIN  HFA) 108 (90 Base) MCG/ACT inhaler, Inhale 2 puffs  into the lungs every 6 (six) hours as needed for wheezing or shortness of breath., Disp: 8 g, Rfl: 0   benzonatate  (TESSALON ) 100 MG capsule, Take 1 capsule (100 mg total) by mouth 2 (two) times daily as needed for cough., Disp: 20 capsule, Rfl: 0   cetirizine  (ZYRTEC  ALLERGY) 10 MG tablet, Take 1 tablet (10 mg total) by mouth daily for 14 days., Disp: 14 tablet, Rfl: 0   fluconazole  (DIFLUCAN ) 150 MG tablet,  Take 1 tablet (150 mg total) by mouth daily., Disp: 1 tablet, Rfl: 0   guaiFENesin -dextromethorphan (ROBITUSSIN DM) 100-10 MG/5ML syrup, Take 5 mLs by mouth every 4 (four) hours as needed for cough., Disp: 118 mL, Rfl: 0   HUMIRA, 2 PEN, 80 MG/0.8ML PNKT, Inject 80 mg into the skin., Disp: , Rfl:    methylPREDNISolone  (MEDROL  DOSEPAK) 4 MG TBPK tablet, Take as directed on packaging., Disp: 1 each, Rfl: 0   metroNIDAZOLE  (FLAGYL ) 500 MG tablet, Take 1 tablet (500 mg total) by mouth 2 (two) times daily., Disp: 14 tablet, Rfl: 0   Multiple Vitamin (MULTIVITAMIN) tablet, Take 1 tablet by mouth daily. , Disp: , Rfl:    Telmisartan -amLODIPine  40-10 MG TABS, Take 1 tablet by mouth daily., Disp: , Rfl:   Observations/Objective: Patient is well-developed, well-nourished in no acute distress.  Resting comfortably  at home.  Head is normocephalic, atraumatic.  No labored breathing.  Speech is clear and coherent with logical content.  Patient is alert and oriented at baseline.    Assessment and Plan:  1. Flu-like symptoms (Primary)  Take anti-viral medicine with food   - oseltamivir  (TAMIFLU ) 75 MG capsule; Take 1 capsule (75 mg total) by mouth 2 (two) times daily for 5 days.  Dispense: 10 capsule; Refill: 0 - promethazine -dextromethorphan (PROMETHAZINE -DM) 6.25-15 MG/5ML syrup; Take 5 mLs by mouth 4 (four) times daily as needed for cough.  Dispense: 118 mL; Refill: 0     Follow Up Instructions: I discussed the assessment and treatment plan with the patient. The patient was provided an opportunity to ask questions and all were answered. The patient agreed with the plan and demonstrated an understanding of the instructions.  A copy of instructions were sent to the patient via MyChart unless otherwise noted below.    The patient was advised to call back or seek an in-person evaluation if the symptoms worsen or if the condition fails to improve as anticipated.    Mardene Shake, FNP

## 2023-12-28 ENCOUNTER — Ambulatory Visit
Admission: EM | Admit: 2023-12-28 | Discharge: 2023-12-28 | Disposition: A | Discharge status: Home / Self Care | Payer: PRIVATE HEALTH INSURANCE | Attending: Family Medicine | Admitting: Family Medicine

## 2023-12-28 DIAGNOSIS — J209 Acute bronchitis, unspecified: Secondary | ICD-10-CM | POA: Diagnosis not present

## 2023-12-28 MED ORDER — PREDNISONE 20 MG PO TABS
40.0000 mg | ORAL_TABLET | Freq: Every day | ORAL | 0 refills | Status: AC
Start: 2023-12-28 — End: 2024-01-02

## 2023-12-28 MED ORDER — BENZONATATE 200 MG PO CAPS
200.0000 mg | ORAL_CAPSULE | Freq: Three times a day (TID) | ORAL | 0 refills | Status: AC | PRN
Start: 2023-12-28 — End: ?

## 2023-12-28 MED ORDER — AZITHROMYCIN 250 MG PO TABS
250.0000 mg | ORAL_TABLET | Freq: Every day | ORAL | 0 refills | Status: AC
Start: 2023-12-28 — End: ?

## 2023-12-28 MED ORDER — BENZONATATE 200 MG PO CAPS
200.0000 mg | ORAL_CAPSULE | Freq: Three times a day (TID) | ORAL | 0 refills | Status: DC | PRN
Start: 2023-12-28 — End: 2023-12-28

## 2023-12-28 MED ORDER — AZITHROMYCIN 250 MG PO TABS
250.0000 mg | ORAL_TABLET | Freq: Every day | ORAL | 0 refills | Status: DC
Start: 2023-12-28 — End: 2023-12-28

## 2023-12-28 MED ORDER — PREDNISONE 20 MG PO TABS
40.0000 mg | ORAL_TABLET | Freq: Every day | ORAL | 0 refills | Status: DC
Start: 2023-12-28 — End: 2023-12-28

## 2023-12-28 MED ORDER — ALBUTEROL SULFATE HFA 108 (90 BASE) MCG/ACT IN AERS
1.0000 | INHALATION_SPRAY | Freq: Four times a day (QID) | RESPIRATORY_TRACT | 0 refills | Status: AC | PRN
Start: 2023-12-28 — End: ?

## 2023-12-28 MED ORDER — ALBUTEROL SULFATE HFA 108 (90 BASE) MCG/ACT IN AERS
1.0000 | INHALATION_SPRAY | Freq: Four times a day (QID) | RESPIRATORY_TRACT | 0 refills | Status: DC | PRN
Start: 2023-12-28 — End: 2023-12-28

## 2023-12-28 NOTE — Discharge Instructions (Addendum)
Start Zithromax as prescribed.  You may use albuterol inhaler as needed for shortness of breath.  Tessalon 3 times a day as needed for your cough.  Start prednisone daily for 5 days tomorrow, 2/19.  Lots of fluids and rest.  Please follow-up with your PCP if your symptoms do not improve.  Please go to the ER for any worsening symptoms.  Hope you feel better soon!

## 2023-12-28 NOTE — ED Triage Notes (Signed)
Pt presents with c/o a constant cough and SOB. Pt states she recently had the FLU but could not take tamiflu bc it made her nauseas.

## 2023-12-28 NOTE — ED Provider Notes (Signed)
[facility]    CSN: [id number] Arrival date & time: 12/28/23  1854      History   Chief Complaint No chief complaint on file.   HPI Michelle Duarte is a 43 y.o. female  presents for evaluation of URI symptoms for 9 days. Patient reports associated symptoms of cough, congestion, shortness of breath, vomiting. Denies diarrhea, fevers, sore throat, ear pain, body aches. Patient does not have a hx of asthma. Patient is not an active smoker.   Reports sick contacts via her family.  Patient did a video visit on 2/10 for 1 day of symptoms.  She was diagnosed with flulike symptoms and was prescribed Tamiflu and Promethazine DM.  She could not complete the Tamiflu as it made her nauseous.  Pt has taken DayQuil OTC for symptoms. Pt has no other concerns at this time.   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   Nonspecific abnormal electrocardiogram (ECG) (EKG) 10/04/2019   Preop cardiovascular exam 10/04/2019   Chronic hypertension in pregnancy 12/09/2015   Left hip pain 02/21/2015   BP (high blood pressure) 01/17/2015   Vaginal delivery 08/06/2011   CONSTIPATION 01/19/2008    Past Surgical History:  Procedure Laterality Date   BREAST SURGERY     reduction   DILATION AND CURETTAGE OF UTERUS     with polypectomy   LAPAROSCOPIC GASTRIC SLEEVE RESECTION     MASS  EXCISION Right 05/15/2016   Procedure: REMOVAL OF RIGHT ARM MASS;  Surgeon: Avel Peace, MD;  Location: [facility] OR;  Service: General;  Laterality: Right;   THERAPEUTIC ABORTION  2000   TUBAL LIGATION  02/01/2016   WISDOM TOOTH EXTRACTION      OB History     Gravida  4   Para  2   Term  2   Preterm      AB  2   Living  2      SAB  1   IAB  1   Ectopic      Multiple  0   Live Births  2            Home Medications    Prior to Admission medications   Medication Sig Start Date End Date Taking? Authorizing Provider  albuterol (VENTOLIN HFA) 108 (90 Base) MCG/ACT inhaler Inhale 1-2 puffs into the lungs every 6 (six) hours as needed. 12/28/23   Radford Pax, NP  azithromycin (ZITHROMAX) 250 MG tablet Take 1 tablet (250 mg total) by mouth daily. Take first 2 tablets together, then 1 every day until finished. 12/28/23   Radford Pax, NP  benzonatate (TESSALON) 200 MG capsule Take 1 capsule (200 mg total) by mouth 3 (three) times daily as needed. 12/28/23   Radford Pax, NP  cetirizine (ZYRTEC ALLERGY) 10 MG  tablet Take 1 tablet (10 mg total) by mouth daily for 14 days. 10/30/23 11/13/23  Laure Kidney, PA-C  guaiFENesin-dextromethorphan (ROBITUSSIN DM) 100-10 MG/5ML syrup Take 5 mLs by mouth every 4 (four) hours as needed for cough. 10/30/23   McLean, Darius, PA-C  HUMIRA, 2 PEN, 80 MG/0.8ML PNKT Inject 80 mg into the skin.    [provider]  Multiple Vitamin (MULTIVITAMIN) tablet Take 1 tablet by mouth daily.     [provider]  predniSONE (DELTASONE) 20 MG tablet Take 2 tablets (40 mg total) by mouth daily with breakfast for 5 days. 12/28/23 01/02/24  Radford Pax, NP  promethazine-dextromethorphan (PROMETHAZINE-DM) 6.25-15 MG/5ML syrup Take 5 mLs by mouth 4 (four) times daily as needed for cough. 12/20/23   Viviano Simas, FNP  telmisartan (MICARDIS) 40 MG tablet Take 1 tablet by mouth daily. 08/18/23   [provider]  Telmisartan-amLODIPine 40-10  MG TABS Take 1 tablet by mouth daily. 07/09/22   [provider]    Family History Family History  Problem Relation Age of Onset   Hypertension Mother    Drug abuse Mother        recovering addict   Stroke Mother    Heart disease Mother    Hypertension Maternal Aunt    Diabetes Paternal Aunt    Hypertension Maternal Grandmother    Diabetes Paternal Grandmother    Hypertension Brother     Social History Social History   Tobacco Use   Smoking status: Former    Types: Cigars    Quit date: 08/2019    Years since quitting: 4.3   Smokeless tobacco: Never   Tobacco comments:    1 black and MIld a day  Vaping Use   Vaping status: Never Used  Substance Use Topics   Alcohol use: No    Alcohol/week: 0.0 standard drinks of alcohol   Drug use: No     Allergies   Benadryl [diphenhydramine hcl]   Review of Systems Review of Systems  HENT:  Positive for congestion.   Respiratory:  Positive for cough and shortness of breath.      Physical Exam Triage Vital Signs ED Triage Vitals  Encounter Vitals Group     BP 12/28/23 1905 (!) 145/96     Systolic BP Percentile --      Diastolic BP Percentile --      Pulse Rate 12/28/23 1905 61     Resp 12/28/23 1905 17     Temp 12/28/23 1905 98.9 F (37.2 C)     Temp Source 12/28/23 1905 Oral     SpO2 12/28/23 1905 93 %     Weight --      Height --      Head Circumference --      Peak Flow --      Pain Score 12/28/23 1904 0     Pain Loc --      Pain Education --      Exclude from Growth Chart --    No data found.  Updated Vital Signs BP (!) 145/96 (BP Location: Left Arm)   Pulse 61   Temp 98.9 F (37.2 C) (Oral)   Resp 17   LMP 12/11/2023 (Exact Date)   SpO2 93%   Visual Acuity Right Eye Distance:   Left Eye Distance:   Bilateral Distance:    Right Eye Near:   Left Eye Near:    Bilateral Near:     Physical Exam Vitals and nursing note  reviewed.  Constitutional:      General: She is not in acute  distress.    Appearance: She is well-developed. She is not ill-appearing.  HENT:     Head: Normocephalic and atraumatic.     Right Ear: Tympanic membrane and ear canal normal.     Left Ear: Tympanic membrane and ear canal normal.     Nose: Congestion present.     Mouth/Throat:     Mouth: Mucous membranes are moist.     Pharynx: Oropharynx is clear. Uvula midline. No posterior oropharyngeal erythema.     Tonsils: No tonsillar exudate or tonsillar abscesses.  Eyes:     Conjunctiva/sclera: Conjunctivae normal.     Pupils: Pupils are equal, round, and reactive to light.  Cardiovascular:     Rate and Rhythm: Normal rate and regular rhythm.     Heart sounds: Normal heart sounds.  Pulmonary:     Effort: Pulmonary effort is normal.     Breath sounds: Normal breath sounds. No wheezing or rhonchi.  Musculoskeletal:     Cervical back: Normal range of motion and neck supple.  Lymphadenopathy:     Cervical: No cervical adenopathy.  Skin:    General: Skin is warm and dry.  Neurological:     General: No focal deficit present.     Mental Status: She is alert and oriented to person, place, and time.  Psychiatric:        Mood and Affect: Mood normal.        Behavior: Behavior normal.      UC Treatments / Results  Labs (all labs ordered are listed, but only abnormal results are displayed) Labs Reviewed - No data to display  EKG   Radiology No results found.  Procedures Procedures (including critical care time)  Medications Ordered in UC Medications - No data to display  Initial Impression / Assessment and Plan / UC Course  I have reviewed the triage vital signs and the nursing notes.  Pertinent labs & imaging results that were available during my care of the patient were reviewed by me and considered in my medical decision making (see chart for details).  Clinical Course as of 12/28/23 1920  Tue Dec 28, 2023  1917 O2 on recheck 96% on room air [JM]    Clinical Course User  Index [JM] Radford Pax, NP    Reviewed exam and symptoms with patient.  No red flags.  Discussed bronchitis and given length of symptoms we will start Zithromax.  Tessalon and albuterol inhaler as needed.  Prednisone daily for 5 days.  Encourage fluids and rest.  PCP follow-up if symptoms do not improve.  ER precautions reviewed and patient verbalized understanding. Final Clinical Impressions(s) / UC Diagnoses   Final diagnoses:  Acute bronchitis, unspecified organism     Discharge Instructions      Start Zithromax as prescribed.  You may use albuterol inhaler as needed for shortness of breath.  Tessalon 3 times a day as needed for your cough.  Start prednisone daily for 5 days tomorrow, 2/19.  Lots of fluids and rest.  Please follow-up with your PCP if your symptoms do not improve.  Please go to the ER for any worsening symptoms.  Hope you feel better soon!    ED Prescriptions     Medication Sig Dispense Auth. Provider   azithromycin (ZITHROMAX) 250 MG tablet  (Status: Discontinued) Take 1 tablet (250 mg total) by mouth daily. Take first 2 tablets together, then 1  every day until finished. 6 tablet Radford Pax, NP   albuterol (VENTOLIN HFA) 108 (90 Base) MCG/ACT inhaler  (Status: Discontinued) Inhale 1-2 puffs into the lungs every 6 (six) hours as needed. 1 each Radford Pax, NP   benzonatate (TESSALON) 200 MG capsule  (Status: Discontinued) Take 1 capsule (200 mg total) by mouth 3 (three) times daily as needed. 20 capsule Radford Pax, NP   predniSONE (DELTASONE) 20 MG tablet  (Status: Discontinued) Take 2 tablets (40 mg total) by mouth daily with breakfast for 5 days. 10 tablet Radford Pax, NP   albuterol (VENTOLIN HFA) 108 (90 Base) MCG/ACT inhaler Inhale 1-2 puffs into the lungs every 6 (six) hours as needed. 1 each Radford Pax, NP   azithromycin (ZITHROMAX) 250 MG tablet Take 1 tablet (250 mg total) by mouth daily. Take first 2 tablets together, then 1 every day until  finished. 6 tablet Radford Pax, NP   benzonatate (TESSALON) 200 MG capsule Take 1 capsule (200 mg total) by mouth 3 (three) times daily as needed. 20 capsule Radford Pax, NP   predniSONE (DELTASONE) 20 MG tablet Take 2 tablets (40 mg total) by mouth daily with breakfast for 5 days. 10 tablet Radford Pax, NP      PDMP not reviewed this encounter.   Radford Pax, NP 12/28/23 Jerene Bears

## 2024-03-26 ENCOUNTER — Other Ambulatory Visit: Payer: Self-pay

## 2024-03-26 ENCOUNTER — Emergency Department (HOSPITAL_BASED_OUTPATIENT_CLINIC_OR_DEPARTMENT_OTHER)
Admission: EM | Admit: 2024-03-26 | Discharge: 2024-03-26 | Disposition: A | Discharge status: Home / Self Care | Payer: PRIVATE HEALTH INSURANCE | Attending: Emergency Medicine | Admitting: Emergency Medicine

## 2024-03-26 ENCOUNTER — Encounter (HOSPITAL_BASED_OUTPATIENT_CLINIC_OR_DEPARTMENT_OTHER): Payer: Self-pay

## 2024-03-26 DIAGNOSIS — E876 Hypokalemia: Secondary | ICD-10-CM | POA: Diagnosis not present

## 2024-03-26 DIAGNOSIS — Z79899 Other long term (current) drug therapy: Secondary | ICD-10-CM | POA: Insufficient documentation

## 2024-03-26 DIAGNOSIS — R519 Headache, unspecified: Secondary | ICD-10-CM | POA: Insufficient documentation

## 2024-03-26 DIAGNOSIS — I10 Essential (primary) hypertension: Secondary | ICD-10-CM | POA: Diagnosis not present

## 2024-03-26 LAB — COMPREHENSIVE METABOLIC PANEL WITH GFR
ALT: 12 U/L (ref 0–44)
AST: 19 U/L (ref 15–41)
Albumin: 3.8 g/dL (ref 3.5–5.0)
Alkaline Phosphatase: 83 U/L (ref 38–126)
Anion gap: 12 (ref 5–15)
BUN: 14 mg/dL (ref 6–20)
CO2: 26 mmol/L (ref 22–32)
Calcium: 9.4 mg/dL (ref 8.9–10.3)
Chloride: 101 mmol/L (ref 98–111)
Creatinine, Ser: 1.06 mg/dL — ABNORMAL HIGH (ref 0.44–1.00)
GFR, Estimated: >60 mL/min (ref >60)
Glucose, Bld: 104 mg/dL — ABNORMAL HIGH (ref 70–99)
Potassium: 3.1 mmol/L — ABNORMAL LOW (ref 3.5–5.1)
Sodium: 139 mmol/L (ref 135–145)
Total Bilirubin: 0.2 mg/dL (ref 0.0–1.2)
Total Protein: 7.2 g/dL (ref 6.5–8.1)

## 2024-03-26 LAB — CBC
HCT: 42.7 % (ref 36.0–46.0)
Hemoglobin: 14.1 g/dL (ref 12.0–15.0)
MCH: 28 pg (ref 26.0–34.0)
MCHC: 33 g/dL (ref 30.0–36.0)
MCV: 84.9 fL (ref 80.0–100.0)
Platelets: 260 10*3/uL (ref 150–400)
RBC: 5.03 MIL/uL (ref 3.87–5.11)
RDW: 15.1 % (ref 11.5–15.5)
WBC: 8.7 10*3/uL (ref 4.0–10.5)
nRBC: 0 % (ref 0.0–0.2)

## 2024-03-26 LAB — HCG, SERUM, QUALITATIVE: Preg, Serum: NEGATIVE

## 2024-03-26 MED ORDER — POTASSIUM CHLORIDE CRYS ER 20 MEQ PO TBCR
40.0000 meq | EXTENDED_RELEASE_TABLET | Freq: Once | ORAL | Status: AC
  Administered 2024-03-26: 40 meq via ORAL
  Filled 2024-03-26: qty 2

## 2024-03-26 MED ORDER — ACETAMINOPHEN 500 MG PO TABS
1000.0000 mg | ORAL_TABLET | Freq: Once | ORAL | Status: AC
  Administered 2024-03-26: 1000 mg via ORAL
  Filled 2024-03-26: qty 2

## 2024-03-26 MED ORDER — METOCLOPRAMIDE HCL 10 MG PO TABS
20.0000 mg | ORAL_TABLET | Freq: Once | ORAL | Status: AC
  Administered 2024-03-26: 20 mg via ORAL
  Filled 2024-03-26: qty 2

## 2024-03-26 NOTE — Discharge Instructions (Signed)
 You were seen in the emergency room for headache and high blood pressure Your blood work showed low potassium but no other abnormalities It does not appear as though you are severely dehydrated Your blood pressure was slightly high at times but was borderline normal otherwise Your headache was under control You can continue taking Tylenol  as directed for headaches Follow-up with your primary care doctor to have labs redrawn to see if your potassium is still low You may need to start on a potassium supplement if it stays low Return to emergency department for severe headaches or any other concerns Continue taking all previous prescribed medication

## 2024-03-26 NOTE — ED Triage Notes (Addendum)
 Arrives POV with complaints of being hypertension and headaches x1 week. Patient took her BP this morning and her systolic was the 130'Q. Concerned she may be getting dehydrated as well (bariatric surgery 12/2019).

## 2024-03-26 NOTE — ED Provider Notes (Signed)
 [place] EMERGENCY DEPARTMENT AT [facility] Provider Note   CSN: [id number] Arrival date & time: 03/26/24  1313     History  Chief Complaint  Patient presents with   Hypertension   Headache    Michelle Duarte is a 43 y.o. female.  With a history of hypertension, status post sleeve gastrectomy, hypokalemia and iron deficiency anemia who presents to the ED for headache.  Patient reports ongoing headaches for 1 week.  Headache localized over frontal region with associated tension on both sides of her neck.  No fevers meningismus.  Is concerned for hypertension given that her systolic blood pressure was in the 150s at home today.  Feels as though she might be dehydrated.  No nausea vomiting diarrhea chest pain shortness of breath.  No change in speech or focal weakness   Hypertension Associated symptoms include headaches.  Headache      Home Medications Prior to Admission medications   Medication Sig Start Date End Date Taking? Authorizing Provider  amLODipine  (NORVASC ) 10 MG tablet Take 10 mg by mouth daily.   Yes [provider]  telmisartan  (MICARDIS ) 40 MG tablet Take 1 tablet by mouth daily. 08/18/23  Yes [provider]  albuterol  (VENTOLIN  HFA) 108 (90 Base) MCG/ACT inhaler Inhale 1-2 puffs into the lungs every 6 (six) hours as needed. 12/28/23   Mayer, Jodi R, NP  azithromycin  (ZITHROMAX ) 250 MG tablet Take 1 tablet (250 mg total) by mouth daily. Take first 2 tablets together, then 1 every day until finished. 12/28/23   Mayer, Jodi R, NP  benzonatate  (TESSALON ) 200 MG capsule Take 1 capsule (200 mg total) by mouth 3 (three) times daily as needed. 12/28/23   Mayer, Jodi R, NP  cetirizine  (ZYRTEC  ALLERGY) 10 MG tablet Take 1 tablet (10 mg total) by mouth daily for 14 days. 10/30/23 11/13/23  Marciana Settle, PA-C  guaiFENesin -dextromethorphan (ROBITUSSIN DM) 100-10 MG/5ML syrup Take 5 mLs by mouth every 4 (four) hours as needed for cough. 10/30/23   McLean,  Darius, PA-C  HUMIRA, 2 PEN, 80 MG/0.8ML PNKT Inject 80 mg into the skin.    [provider]  Multiple Vitamin (MULTIVITAMIN) tablet Take 1 tablet by mouth daily.     [provider]  promethazine -dextromethorphan (PROMETHAZINE -DM) 6.25-15 MG/5ML syrup Take 5 mLs by mouth 4 (four) times daily as needed for cough. 12/20/23   Mardene Shake, FNP  Telmisartan -amLODIPine  40-10 MG TABS Take 1 tablet by mouth daily. 07/09/22   [provider]      Allergies    Benadryl  [diphenhydramine  hcl]    Review of Systems   Review of Systems  Neurological:  Positive for headaches.    Physical Exam Updated Vital Signs BP (!) 141/91   Pulse (!) 59   Temp 98.2 F (36.8 C)   Resp 16   Ht 5\' 3"  (1.6 m)   Wt 86.6 kg   SpO2 99%   BMI 33.82 kg/m  Physical Exam Vitals and nursing note reviewed.  HENT:     Head: Normocephalic and atraumatic.  Eyes:     Pupils: Pupils are equal, round, and reactive to light.  Cardiovascular:     Rate and Rhythm: Normal rate and regular rhythm.  Pulmonary:     Effort: Pulmonary effort is normal.     Breath sounds: Normal breath sounds.  Abdominal:     Palpations: Abdomen is soft.     Tenderness: There is no abdominal tenderness.  Skin:    General: Skin is  warm and dry.  Neurological:     Mental Status: She is alert.     Sensory: No sensory deficit.     Motor: No weakness.  Psychiatric:        Mood and Affect: Mood normal.     ED Results / Procedures / Treatments   Labs (all labs ordered are listed, but only abnormal results are displayed) Labs Reviewed  COMPREHENSIVE METABOLIC PANEL WITH GFR - Abnormal; Notable for the following components:      Result Value   Potassium 3.1 (*)    Glucose, Bld 104 (*)    Creatinine, Ser 1.06 (*)    All other components within normal limits  CBC  HCG, SERUM, QUALITATIVE    EKG None  Radiology No results found.  Procedures Procedures    Medications Ordered in ED Medications   potassium chloride  SA (KLOR-CON  M) CR tablet 40 mEq (40 mEq Oral Given 03/26/24 1603)  acetaminophen  (TYLENOL ) tablet 1,000 mg (1,000 mg Oral Given 03/26/24 1601)  metoCLOPramide  (REGLAN ) tablet 20 mg (20 mg Oral Given 03/26/24 1602)    ED Course/ Medical Decision Making/ A&P Clinical Course as of 03/26/24 1627  Sun Mar 26, 2024  1627 Headache improved.  Patient requesting discharge at this time.  Stable for PCP follow-up [MP]    Clinical Course User Index [MP] Sallyanne Creamer, DO                                 Medical Decision Making 43 year old female with history as above presenting for headache concern dehydration.  Afebrile slightly hypertensive in the 140s here but systolic in 120s upon my assessment.  No red flag neurosymptoms neurologically intact.  Low suspicion for TIA or CVA.  Differential diagnosis for headache would be migraine headache versus tension headache.  She has been taking Tylenol  which provides moderate relief.  Cannot take NSAIDs as she is a sleeve gastrectomy patient.  Will give another dose of Tylenol  and try Reglan  here to see if that helps.  Laboratory workup notable for hypokalemia will provide oral repletion.  No other significant laboratory abnormalities.  Pregnancy negative.  If her headache is under control will be appropriate for outpatient follow-up with her PCP  Amount and/or Complexity of Data Reviewed Labs: ordered.  Risk OTC drugs. Prescription drug management.           Final Clinical Impression(s) / ED Diagnoses Final diagnoses:  Acute nonintractable headache, unspecified headache type  Hypokalemia    Rx / DC Orders ED Discharge Orders     None         Sallyanne Creamer, DO 03/26/24 1627

## 2024-03-26 NOTE — ED Notes (Signed)
 Dc instructions reviewed with patient. Patient voiced understanding. Dc with belongings.

## 2024-08-24 ENCOUNTER — Other Ambulatory Visit: Payer: Self-pay

## 2024-08-24 ENCOUNTER — Emergency Department (HOSPITAL_BASED_OUTPATIENT_CLINIC_OR_DEPARTMENT_OTHER)
Admission: EM | Admit: 2024-08-24 | Discharge: 2024-08-24 | Discharge status: Against Medical Advice | Payer: PRIVATE HEALTH INSURANCE | Attending: Emergency Medicine | Admitting: Emergency Medicine

## 2024-08-24 ENCOUNTER — Emergency Department (HOSPITAL_BASED_OUTPATIENT_CLINIC_OR_DEPARTMENT_OTHER): Payer: PRIVATE HEALTH INSURANCE | Admitting: Radiology

## 2024-08-24 DIAGNOSIS — Z5321 Procedure and treatment not carried out due to patient leaving prior to being seen by health care provider: Secondary | ICD-10-CM | POA: Insufficient documentation

## 2024-08-24 DIAGNOSIS — R079 Chest pain, unspecified: Secondary | ICD-10-CM | POA: Insufficient documentation

## 2024-08-24 LAB — RESP PANEL BY RT-PCR (RSV, FLU A&B, COVID)  RVPGX2
Influenza A by PCR: NEGATIVE
Influenza B by PCR: NEGATIVE
Resp Syncytial Virus by PCR: NEGATIVE
SARS Coronavirus 2 by RT PCR: NEGATIVE

## 2024-08-24 LAB — BASIC METABOLIC PANEL WITH GFR
Anion gap: 9 (ref 5–15)
BUN: 16 mg/dL (ref 6–20)
CO2: 26 mmol/L (ref 22–32)
Calcium: 8.9 mg/dL (ref 8.9–10.3)
Chloride: 103 mmol/L (ref 98–111)
Creatinine, Ser: 0.9 mg/dL (ref 0.44–1.00)
GFR, Estimated: >60 mL/min (ref >60)
Glucose, Bld: 90 mg/dL (ref 70–99)
Potassium: 3.8 mmol/L (ref 3.5–5.1)
Sodium: 139 mmol/L (ref 135–145)

## 2024-08-24 LAB — PREGNANCY, URINE: Preg Test, Ur: NEGATIVE

## 2024-08-24 LAB — CBC
HCT: 38.2 % (ref 36.0–46.0)
Hemoglobin: 12.4 g/dL (ref 12.0–15.0)
MCH: 26.8 pg (ref 26.0–34.0)
MCHC: 32.5 g/dL (ref 30.0–36.0)
MCV: 82.5 fL (ref 80.0–100.0)
Platelets: 256 K/uL (ref 150–400)
RBC: 4.63 MIL/uL (ref 3.87–5.11)
RDW: 15 % (ref 11.5–15.5)
WBC: 8 K/uL (ref 4.0–10.5)
nRBC: 0 % (ref 0.0–0.2)

## 2024-08-24 LAB — TROPONIN T, HIGH SENSITIVITY: Troponin T High Sensitivity: <15 ng/L (ref 0–19)

## 2024-08-24 NOTE — ED Triage Notes (Signed)
 Patient states chest pain since this morning. Called EMS who did EKG and recommended she go to the hospital. She declined at this time but pain has since worsened.

## 2024-09-19 ENCOUNTER — Encounter: Payer: Self-pay | Admitting: Dermatology

## 2024-09-19 ENCOUNTER — Ambulatory Visit (INDEPENDENT_AMBULATORY_CARE_PROVIDER_SITE_OTHER): Payer: PRIVATE HEALTH INSURANCE | Admitting: Dermatology

## 2024-09-19 VITALS — BP 141/91 | HR 63

## 2024-09-19 DIAGNOSIS — L649 Androgenic alopecia, unspecified: Secondary | ICD-10-CM | POA: Diagnosis not present

## 2024-09-19 DIAGNOSIS — L6681 Central centrifugal cicatricial alopecia: Secondary | ICD-10-CM

## 2024-09-19 DIAGNOSIS — Z79899 Other long term (current) drug therapy: Secondary | ICD-10-CM | POA: Diagnosis not present

## 2024-09-19 DIAGNOSIS — L700 Acne vulgaris: Secondary | ICD-10-CM | POA: Diagnosis not present

## 2024-09-19 MED ORDER — SAFETY SEAL MISCELLANEOUS MISC: 6 refills | Status: AC

## 2024-09-19 NOTE — Progress Notes (Signed)
   New Patient Visit   Subjective  Michelle Duarte is a 43 y.o. female who presents for the following: hair loss  Patient states she has hair loss located at the crown of the scalp scalp that she would like to have examined. Patient reports the areas have been there for 1 year.  She reports the areas are not bothersome. She states that the areas have not spread.  Patient reports she has not previously been treated for these areas.  Patient reports she has used Hers hair solution and has used [employer] hair growth only the past two days Patient reports she using vital proteins collagen supplement every morning  Patient is currently using Raveda high performance shampoo/conditioner Patient reports she sometimes uses rosemary oil on the scalp Patient reports in the past she would wear braids and tight hairstyles Patient reports that she has used relaxers   The following portions of the chart were reviewed this encounter and updated as appropriate: medications, allergies, medical history  Review of Systems:  No other skin or systemic complaints except as noted in HPI or Assessment and Plan.  Objective  Well appearing patient in no apparent distress; mood and affect are within normal limits.   A focused examination was performed of the following areas: Scalp  Relevant exam findings are noted in the Assessment and Plan.               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  In women androgenetic alopecia is commonly associated with menopause but may occur any time after puberty.  It causes hair thinning primarily on the crown with widening of the part and temporal hairline recession  CCCA with clinical signs  of scarring, likely initiated 10-15 years ago, exacerbated by chemical relaxers and tight hairstyles. Genetic component present. Inflammation cessation is crucial to halt progression. Some hair follicles may regrow, but scarred follicles will not.  Treatment Plan: Prescribed AA Gel Minoxidil 8%, Clobetasol 0.05% and Finasteride 0.05% Advised to continue Vital Proteins Collagen Supplement Recommended Viviscal, 2 tablets daily or Nutrofol Recommended to stop any chemical treatments on hair relaxer etc.  Long term medication management.  Patient is using long term (months to years) prescription medication  to control their dermatologic condition.  These medications require periodic monitoring to evaluate for efficacy and side effects and may require periodic laboratory monitoring.   ACNE VULGARIS Exam: Open comedones and inflammatory papules on face  Treatment Plan: Recommend Avene retinoid       Return in about 6 months (around 03/19/2025) for Alopecia F/U.  I, Lyle Cords, as acting as a neurosurgeon for [employer], DO .   Documentation: I have reviewed the above documentation for accuracy and completeness, and I agree with the above.  Delon Lenis, DO

## 2024-09-19 NOTE — Patient Instructions (Addendum)
 VISIT SUMMARY:  Today, you were seen for concerns about hair thinning and scalp issues. You have been experiencing hair thinning at the top of your scalp for the past year, with increased shedding and more visible scalp. We discussed your hair care history, including the use of texturizers and tight hairstyles, as well as potential hormonal factors. Your hormone levels and ovarian function were evaluated, revealing a low egg count. You are currently using collagen supplements and have ordered a multivitamin-type hair growth formula.  YOUR PLAN:  -CENTRAL CENTRIFUGAL CICATRICIAL ALOPECIA (CCCA):  CCCA is a type of hair loss that involves scarring and is often worsened by chemical relaxers and tight hairstyles. It is important to stop the inflammation to prevent further hair loss.   Some hair may regrow, but scarred areas will not. You have been prescribed clobetasol to reduce inflammation. Please discontinue the use of texturizers and tight hairstyles. Continuous treatment is crucial to prevent progression.  -ANDROGENETIC ALOPECIA:  Androgenetic alopecia is hair thinning due to hormonal changes, common in women in their 54s. Hormonal changes can lead to increased testosterone  levels, affecting hair follicles.   This condition is treatable with continuous treatment. You have been prescribed a compounded topical solution containing clobetasol, minoxidil, and finasteride to apply every morning to the affected areas. Continuous treatment is important for effective results.   We also discussed the potential for hair regrowth and the role of supplements like Nutrafol or Viviscal. Collagen supplements are recommended for overall skin, hair, and nail health.  INSTRUCTIONS:  Please follow up as needed to monitor your progress and adjust treatment if necessary. Continue using the prescribed medications and follow the hair care recommendations provided.        Important Information  Due to recent  changes in healthcare laws, you may see results of your pathology and/or laboratory studies on MyChart before the doctors have had a chance to review them. We understand that in some cases there may be results that are confusing or concerning to you. Please understand that not all results are received at the same time and often the doctors may need to interpret multiple results in order to provide you with the best plan of care or course of treatment. Therefore, we ask that you please give us  2 business days to thoroughly review all your results before contacting the office for clarification. Should we see a critical lab result, you will be contacted sooner.   If You Need Anything After Your Visit  If you have any questions or concerns for your doctor, please call our main line at [phone] If no one answers, please leave a voicemail as directed and we will return your call as soon as possible. Messages left after 4 pm will be answered the following business day.   You may also send us  a message via MyChart. We typically respond to MyChart messages within 1-2 business days.  For prescription refills, please ask your pharmacy to contact our office. Our fax number is [phone].  If you have an urgent issue when the clinic is closed that cannot wait until the next business day, you can page your doctor at the number below.    Please note that while we do our best to be available for urgent issues outside of office hours, we are not available 24/7.   If you have an urgent issue and are unable to reach us , you may choose to seek medical care at your doctor's office, retail clinic, urgent care center, or  emergency room.  If you have a medical emergency, please immediately call 911 or go to the emergency department. In the event of inclement weather, please call our main line at [phone] for an update on the status of any delays or closures.  Dermatology Medication Tips: Please keep the boxes  that topical medications come in in order to help keep track of the instructions about where and how to use these. Pharmacies typically print the medication instructions only on the boxes and not directly on the medication tubes.   If your medication is too expensive, please contact our office at [phone] or send us  a message through MyChart.   We are unable to tell what your co-pay for medications will be in advance as this is different depending on your insurance coverage. However, we may be able to find a substitute medication at lower cost or fill out paperwork to get insurance to cover a needed medication.   If a prior authorization is required to get your medication covered by your insurance company, please allow us  1-2 business days to complete this process.  Drug prices often vary depending on where the prescription is filled and some pharmacies may offer cheaper prices.  The website www.goodrx.com contains coupons for medications through different pharmacies. The prices here do not account for what the cost may be with help from insurance (it may be cheaper with your insurance), but the website can give you the price if you did not use any insurance.  - You can print the associated coupon and take it with your prescription to the pharmacy.  - You may also stop by our office during regular business hours and pick up a GoodRx coupon card.  - If you need your prescription sent electronically to a different pharmacy, notify our office through [facility] or by phone at [phone]

## 2025-02-14 ENCOUNTER — Ambulatory Visit: Payer: PRIVATE HEALTH INSURANCE | Admitting: Dermatology

## 2025-03-29 ENCOUNTER — Ambulatory Visit: Payer: PRIVATE HEALTH INSURANCE | Admitting: Dermatology
